# Patient Record
Sex: Male | Born: 1965 | Hispanic: Yes | Marital: Married | Smoking: Never smoker
Health system: Southern US, Community
[De-identification: ages and names within clinical notes are randomized; demographics above are authoritative.]

## PROBLEM LIST (undated history)

## (undated) DIAGNOSIS — Z789 Other specified health status: Secondary | ICD-10-CM

## (undated) HISTORY — PX: ANTERIOR CRUCIATE LIGAMENT REPAIR: SHX115

---

## 2007-02-16 ENCOUNTER — Emergency Department: Payer: Self-pay | Admitting: Emergency Medicine

## 2007-02-19 ENCOUNTER — Emergency Department: Payer: Self-pay | Admitting: Emergency Medicine

## 2007-05-05 ENCOUNTER — Ambulatory Visit: Payer: Self-pay | Admitting: Family Medicine

## 2010-12-17 ENCOUNTER — Ambulatory Visit: Payer: Self-pay | Admitting: Family Medicine

## 2010-12-17 ENCOUNTER — Encounter: Payer: Self-pay | Admitting: Family Medicine

## 2010-12-17 DIAGNOSIS — J309 Allergic rhinitis, unspecified: Secondary | ICD-10-CM | POA: Insufficient documentation

## 2010-12-17 DIAGNOSIS — J209 Acute bronchitis, unspecified: Secondary | ICD-10-CM

## 2010-12-26 NOTE — Assessment & Plan Note (Signed)
Summary: sinus infection/jbb   Vital Signs:  Patient Profile:   45 Years Old Male CC:      Ongoing Nasal Congestion O2 Sat:      99 % O2 treatment:    Room Air Temp:     97.9 degrees F oral Pulse rate:   92 / minute Pulse rhythm:   regular Resp:     14 per minute BP sitting:   131 / 81  (left arm)  Pt. in pain?   no  Vitals Entered By: Standley Dakins MD (December 17, 2010 6:49 PM)                   Current Allergies (reviewed today): No known allergies History of Present Illness History from: patient Chief Complaint: Ongoing Nasal Congestion History of Present Illness: The patient is presenting today because he continues to have ongoing symptoms of sinus congestion and drainage.  He is having postnasal drainage,  coughing and wheezing, especially at night; He saw his PCP several days ago and started taking a Z-pack and is almost completed with it but no significant improvement.  He is wheezing and feels plugged up in his head.  He is also having some maxillary sinus pain.  No fever or chills.  No nausea or diarrhea.    REVIEW OF SYSTEMS Constitutional Symptoms      Denies fever, chills, night sweats, weight loss, weight gain, and fatigue.  Eyes       Denies change in vision, eye pain, eye discharge, glasses, contact lenses, and eye surgery. Ear/Nose/Throat/Mouth       Complains of frequent runny nose, sinus problems, sore throat, and hoarseness.      Denies hearing loss/aids, change in hearing, ear pain, ear discharge, dizziness, frequent nose bleeds, and tooth pain or bleeding.  Respiratory       Complains of dry cough and wheezing.      Denies productive cough, shortness of breath, asthma, bronchitis, and emphysema/COPD.  Cardiovascular       Denies murmurs, chest pain, and tires easily with exhertion.    Gastrointestinal       Denies stomach pain, nausea/vomiting, diarrhea, constipation, blood in bowel movements, and indigestion. Genitourniary       Denies painful  urination, kidney stones, and loss of urinary control. Neurological       Denies paralysis, seizures, and fainting/blackouts. Musculoskeletal       Denies muscle pain, joint pain, joint stiffness, decreased range of motion, redness, swelling, muscle weakness, and gout.  Skin       Denies bruising, unusual mles/lumps or sores, and hair/skin or nail changes.  Psych       Denies mood changes, temper/anger issues, anxiety/stress, speech problems, depression, and sleep problems.  Past History:  Past Medical History: Unremarkable  Past Surgical History: Denies surgical history  Family History: No significant per patient  Social History: Occupation: Recruitment consultant Never Smoked Alcohol use-no Drug use-no Smoking Status:  never Drug Use:  no Physical Exam General appearance: well developed, well nourished, no acute distress Head: normocephalic, atraumatic Eyes: conjunctivae and lids normal Pupils: equal, round, reactive to light Ears: normal, no lesions or deformities Nasal: pale, boggy, swollen nasal turbinates Oral/Pharynx: tongue normal, posterior pharynx with erythema but no exudate Neck: neck supple,  trachea midline, no masses Chest/Lungs: no rales, wheezes, or rhonchi bilateral, breath sounds equal without effort Heart: regular rate and  rhythm, no murmur Abdomen: soft, non-tender without obvious organomegaly Extremities: normal extremities Neurological: grossly intact  and non-focal Skin: no obvious rashes or lesions MSE: oriented to time, place, and person Assessment New Problems: ACUTE BRONCHITIS (ICD-466.0) ALLERGIC RHINITIS CAUSE UNSPECIFIED (ICD-477.9)   Patient Education: Patient and/or caregiver instructed in the following: rest, fluids. The risks, benefits and possible side effects were clearly explained and discussed with the patient.  The patient verbalized clear understanding.  The patient was given instructions to return if symptoms don't improve,  worsen or new changes develop.  If it is not during clinic hours and the patient cannot get back to this clinic then the patient was told to seek medical care at an available urgent care or emergency department.  The patient verbalized understanding.   Demonstrates willingness to comply.  Plan New Medications/Changes: VENTOLIN HFA 108 (90 BASE) MCG/ACT AERS (ALBUTEROL SULFATE) 2 puffs inh every 4 hours as needed wheezing, cough, SOB: caution may cause mild palpatations  #1 x 0, 12/17/2010, Clanford Johnson MD FLUTICASONE PROPIONATE 50 MCG/ACT SUSP (FLUTICASONE PROPIONATE) 2 sprays per nostril once daily  #1 x 1, 12/17/2010, Clanford Johnson MD CETIRIZINE HCL 10 MG TABS (CETIRIZINE HCL) take 1 by mouth daily for allergies  #30 x 1, 12/17/2010, Clanford Johnson MD  Follow Up: Follow up in 2-3 days if no improvement, Follow up on an as needed basis, Follow up with Primary Physician  The patient and/or caregiver has been counseled thoroughly with regard to medications prescribed including dosage, schedule, interactions, rationale for use, and possible side effects and they verbalize understanding.  Diagnoses and expected course of recovery discussed and will return if not improved as expected or if the condition worsens. Patient and/or caregiver verbalized understanding.  Prescriptions: VENTOLIN HFA 108 (90 BASE) MCG/ACT AERS (ALBUTEROL SULFATE) 2 puffs inh every 4 hours as needed wheezing, cough, SOB: caution may cause mild palpatations  #1 x 0   Entered and Authorized by:   Standley Dakins MD   Signed by:   Standley Dakins MD on 12/17/2010   Method used:   Electronically to        [facility] [address] (retail)       [address], [address]       [place], [place]  [address]       Ph: [phone]       Fax: [phone]   RxID:   [id number] FLUTICASONE PROPIONATE 50 MCG/ACT SUSP (FLUTICASONE PROPIONATE) 2 sprays per nostril once daily  #1 x 1   Entered and  Authorized by:   Standley Dakins MD   Signed by:   Standley Dakins MD on 12/17/2010   Method used:   Electronically to        [facility] [address] (retail)       [address], [address]       [place], [place]  [address]       Ph: [phone]       Fax: [phone]   RxID:   [id number] CETIRIZINE HCL 10 MG TABS (CETIRIZINE HCL) take 1 by mouth daily for allergies  #30 x 1   Entered and Authorized by:   Standley Dakins MD   Signed by:   Standley Dakins MD on 12/17/2010   Method used:   Electronically to        [facility] [address] (retail)       [address], [address]       [place]  [place], [place]  [address]       Ph: [phone]       Fax: [phone]   RxID:   [id number]   Patient Instructions: 1)  Instructions verbally given to patient. The patient verbalized clear understanding.   2)  Go to the pharmacy and pick up your prescription (s).  It may take up to 30 mins for electronic prescriptions to be delivered to the pharmacy.  Please call if your pharmacy has not received your prescriptions after 30 minutes.   3)  Return or go to the ER if no improvement or symptoms getting worse.   4)  Finish the azithromycin medication as prescribed.  5)  Please start your new allergy medications right away.  6)  The patient was informed that there is no on-call provider or services available at this clinic during off-hours (when the clinic is closed).  If the patient developed a problem or concern that required immediate attention, the patient was advised to go the the nearest available urgent care or emergency department for medical care.  The patient verbalized understanding.

## 2018-09-29 ENCOUNTER — Other Ambulatory Visit: Payer: Self-pay | Admitting: Internal Medicine

## 2018-09-29 ENCOUNTER — Ambulatory Visit
Admission: RE | Admit: 2018-09-29 | Discharge: 2018-09-29 | Disposition: A | Discharge status: Home / Self Care | Payer: PRIVATE HEALTH INSURANCE | Source: Ambulatory Visit | Attending: Internal Medicine | Admitting: Internal Medicine

## 2018-09-29 DIAGNOSIS — K352 Acute appendicitis with generalized peritonitis, without abscess: Secondary | ICD-10-CM

## 2018-09-29 MED ORDER — IOPAMIDOL (ISOVUE-300) INJECTION 61%
100.0000 mL | Freq: Once | INTRAVENOUS | Status: AC | PRN
  Administered 2018-09-29: 100 mL via INTRAVENOUS

## 2019-11-16 ENCOUNTER — Other Ambulatory Visit: Payer: PRIVATE HEALTH INSURANCE

## 2020-06-28 IMAGING — CT CT ABD-PELV W/ CM
2 of 5 series · 15 of 46 positions shown, 17 images · IV contrast (APPLIED)
Comparison: None.

CLINICAL DATA: Initial evaluation for acute right lower quadrant
abdominal pain with fever.

EXAM:
CT ABDOMEN AND PELVIS WITH CONTRAST
TECHNIQUE: Multidetector CT imaging of the abdomen and pelvis was performed
using the standard protocol following bolus administration of
intravenous contrast.
CONTRAST:  100mL 64GBWC-PGG IOPAMIDOL (64GBWC-PGG) INJECTION 61%

[Series 2: axial st · axial · 0.76mm/px · z∈[-1189,-739]mm · 12 of 102 slices shown, 14 images]
[im 6/102  soft-tissue]
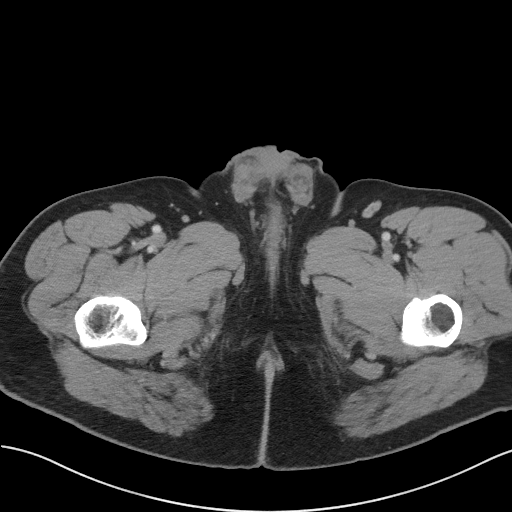
[im 6/102  bone]
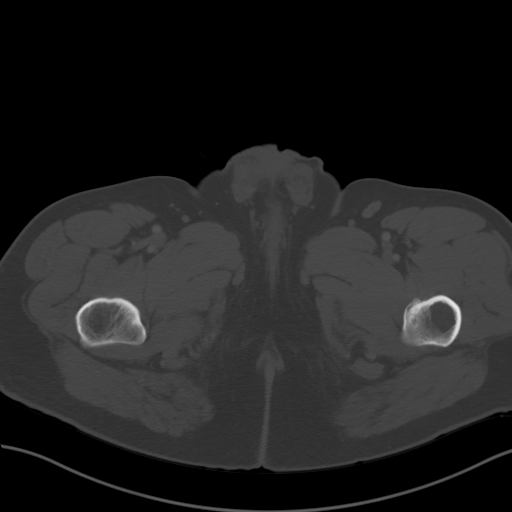
[im 17/102  soft-tissue]
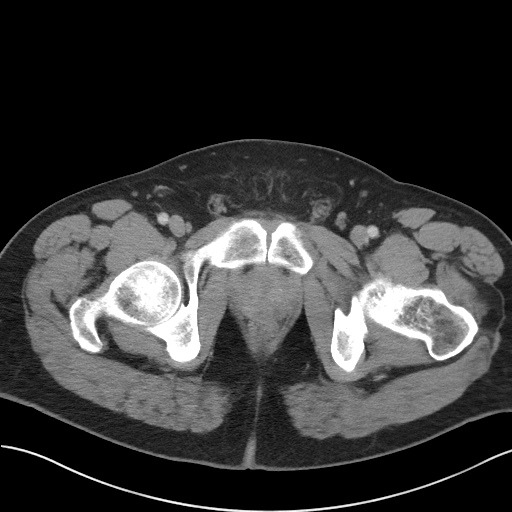
[im 23/102  soft-tissue]
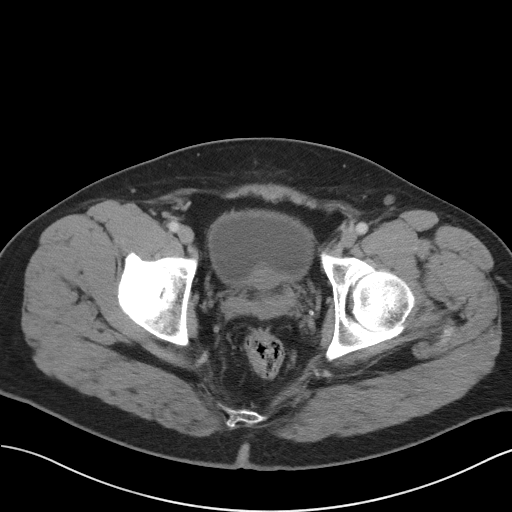
[im 29/102  soft-tissue]
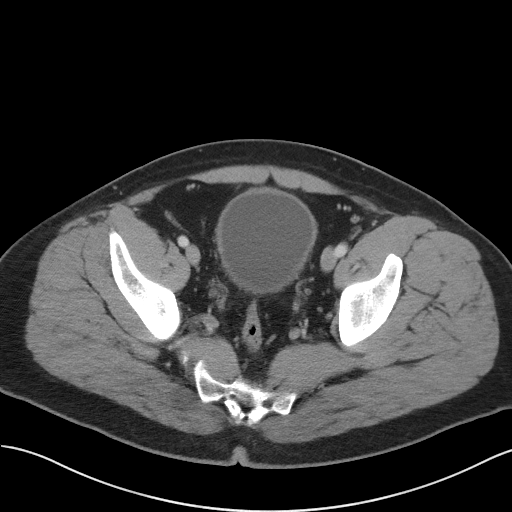
[im 40/102  soft-tissue]
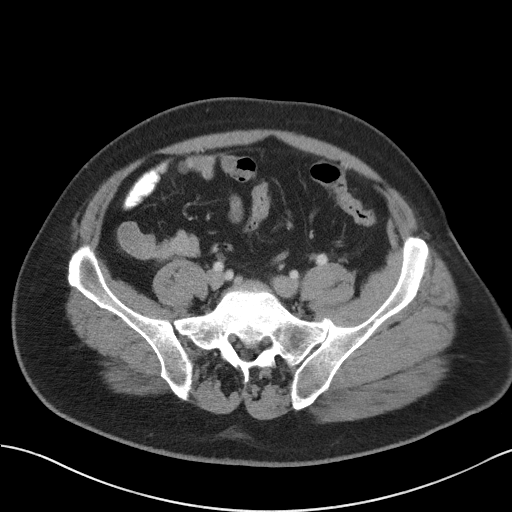
[im 45/102  soft-tissue]
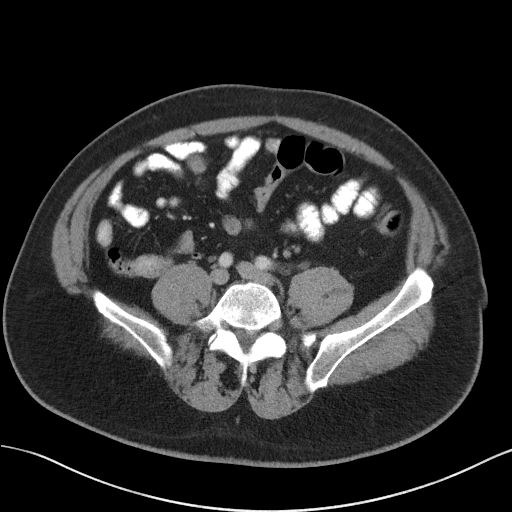
[im 57/102  soft-tissue]
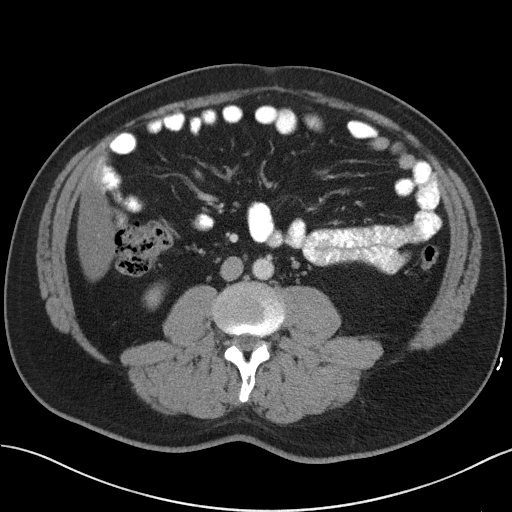
[im 62/102  soft-tissue]
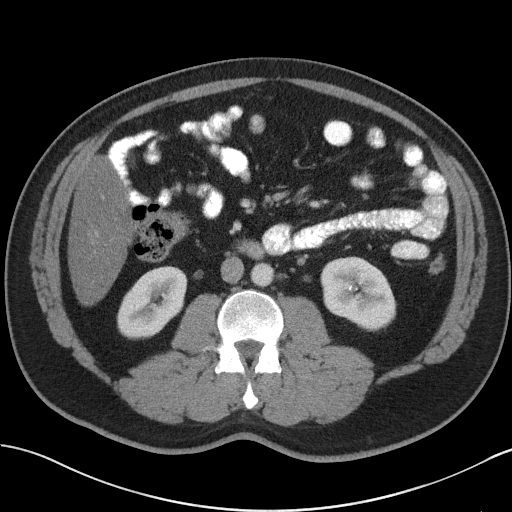
[im 73/102  soft-tissue]
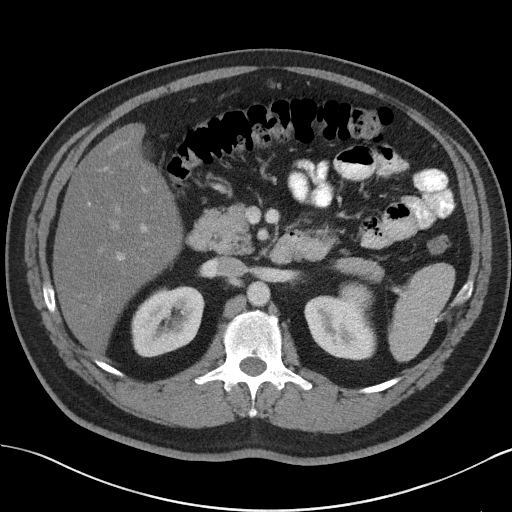
[im 73/102  bone]
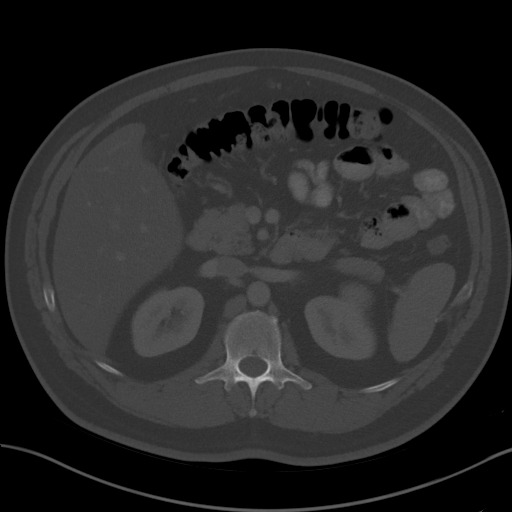
[im 79/102  soft-tissue]
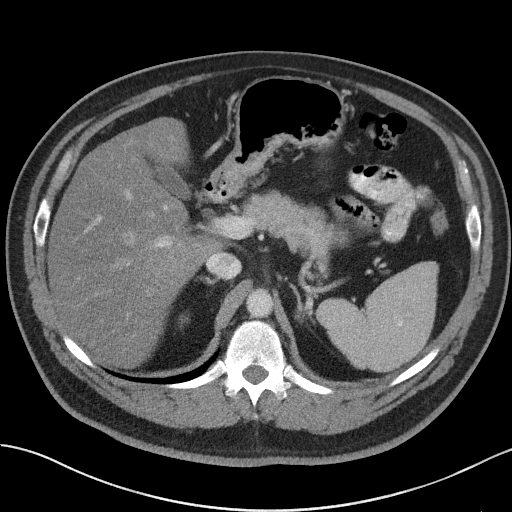
[im 85/102  soft-tissue]
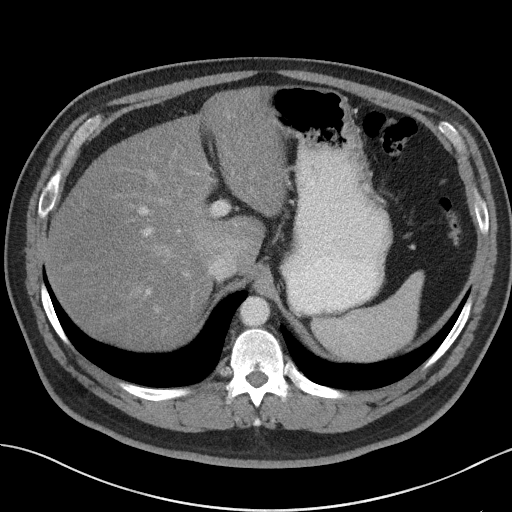
[im 96/102  soft-tissue]
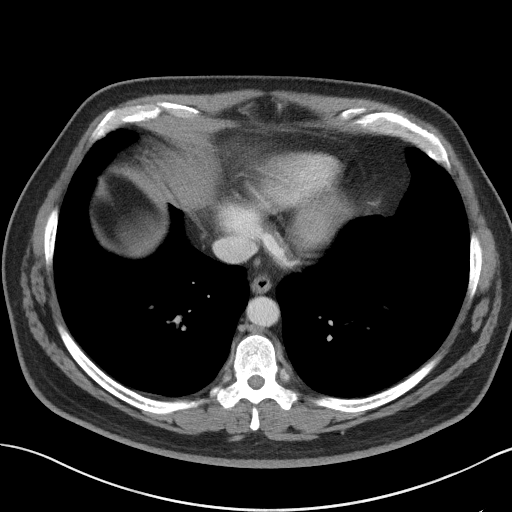

[Series 5: coronal st · coronal · 0.85mm/px · 3 of 103 slices shown]
[im 35/103  soft-tissue]
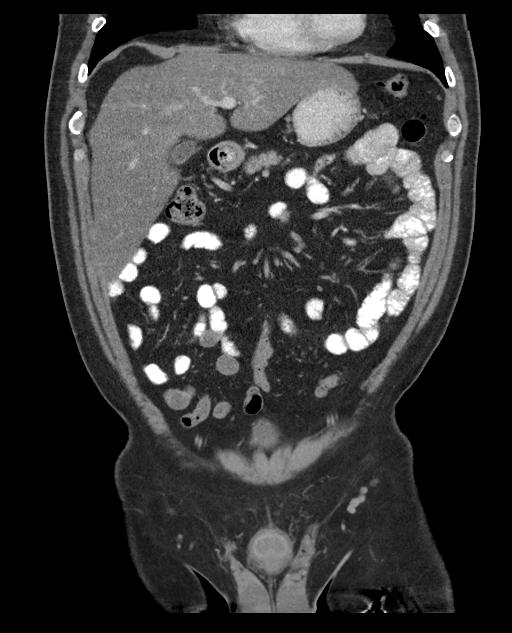
[im 46/103  soft-tissue]
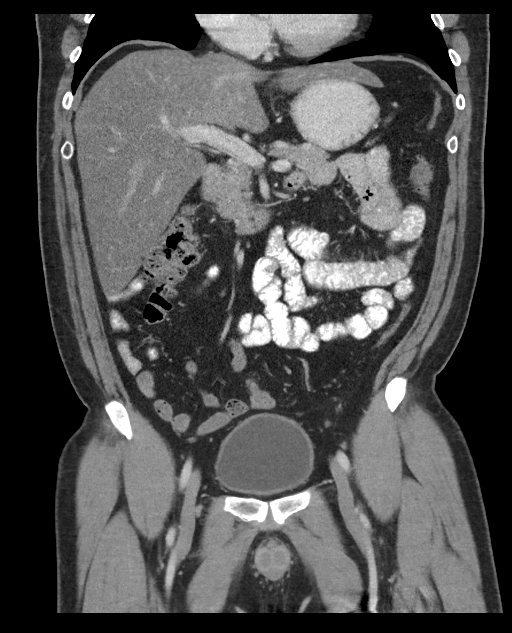
[im 57/103  soft-tissue]
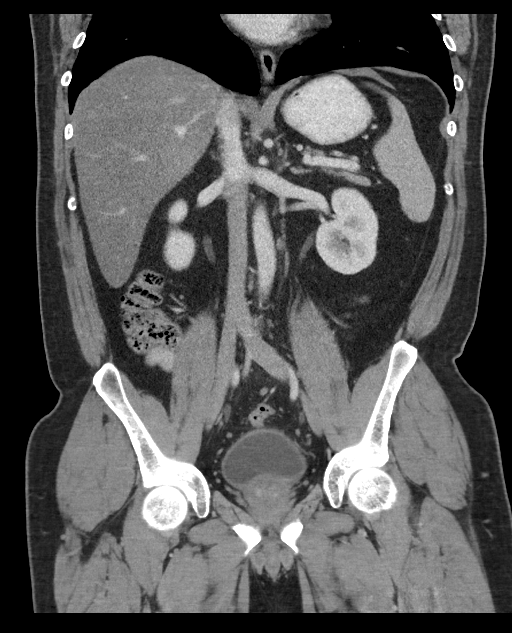

[15 of 46 positions shown; findings below may reference images not displayed]

FINDINGS: Lower chest: Visualized lung bases are clear.

Hepatobiliary: Diffuse hypoattenuation liver consistent with
steatosis. Focal fatty sparing noted adjacent to the gallbladder
fossa. Gallbladder itself demonstrates no acute finding. No biliary
dilatation.

Pancreas: Pancreas within normal limits.

Spleen: Spleen within normal limits.

Adrenals/Urinary Tract: Adrenal glands are normal. Kidneys equal in
size with symmetric enhancement. Subcentimeter hypodensity at the
lower pole the left kidney too small the characterize, but
statistically likely reflects a small cyst. Two small nonobstructive
calculi measuring up to 3 mm present at the lower pole left kidney.
No other radiopaque calculi. No hydronephrosis or hydroureter. No
focal enhancing renal mass. Mild circumferential bladder wall
thickening about the partially distended bladder. While this may be
related incomplete distension, possible acute cystitis could also
have this appearance. Bladder otherwise unremarkable.

Stomach/Bowel: Stomach within normal limits. No evidence for bowel
obstruction. Normal appendix. No abnormal wall thickening, mucosal
enhancement, or inflammatory fat stranding seen elsewhere about the
bowels.

Vascular/Lymphatic: Normal intravascular enhancement seen throughout
the intra-abdominal aorta and its branch vessels. No aneurysm. No
adenopathy.

Reproductive: Prostate enlarged measuring 5.5 cm in transverse
diameter.

Other: No free air or fluid. No made of a 11 mm focus of fat
necrosis within the left anterior abdomen (series 2, image 52),
likely incidental in nature and of doubtful clinical significance
given patient's symptoms.

Musculoskeletal: No acute osseus abnormality. No discrete lytic or
blastic osseous lesions.
IMPRESSION: 1. Mild circumferential bladder wall thickening. While this finding
may be related incomplete distension, possible acute cystitis could
also have this appearance. Correlation with urinalysis recommended.
2. No other acute intra-abdominal or pelvic process. Normal
appendix.
3. Hepatic steatosis.
4. Enlarged prostate.

## 2020-10-30 ENCOUNTER — Other Ambulatory Visit: Payer: PRIVATE HEALTH INSURANCE

## 2020-10-30 DIAGNOSIS — Z20822 Contact with and (suspected) exposure to covid-19: Secondary | ICD-10-CM

## 2020-10-31 LAB — SARS-COV-2, NAA 2 DAY TAT

## 2020-10-31 LAB — NOVEL CORONAVIRUS, NAA: SARS-CoV-2, NAA: NOT DETECTED

## 2021-08-01 NOTE — Progress Notes (Deleted)
  [facility] [address] [place] [address] Phone: [phone] Subjective:    I'm seeing this patient by the request  of:  Danella Penton, MD  CC:   [id number]  Samik Balkcom is a 55 y.o. male coming in with complaint of R ankle pain. Patient states       No past medical history on file. *** The histories are not reviewed yet. Please review them in the "History" navigator section and refresh this SmartLink. Social History   Socioeconomic History   Marital status: Married    Spouse name: Not on file   Number of children: Not on file   Years of education: Not on file   Highest education level: Not on file  Occupational History   Not on file  Tobacco Use   Smoking status: Not on file   Smokeless tobacco: Not on file  Substance and Sexual Activity   Alcohol use: Not on file   Drug use: Not on file   Sexual activity: Not on file  Other Topics Concern   Not on file  Social History Narrative   Not on file   Social Determinants of Health   Financial Resource Strain: Not on file  Food Insecurity: Not on file  Transportation Needs: Not on file  Physical Activity: Not on file  Stress: Not on file  Social Connections: Not on file   Not on File No family history on file. No current outpatient medications on file.   Reviewed prior external information including notes and imaging from  primary care provider As well as notes that were available from care everywhere and other healthcare systems.  Past medical history, social, surgical and family history all reviewed in electronic medical record.  No pertanent information unless stated regarding to the chief complaint.   Review of Systems:  No headache, visual changes, nausea, vomiting, diarrhea, constipation, dizziness, abdominal pain, skin rash, fevers, chills, night sweats, weight loss, swollen lymph nodes, body aches, joint swelling, chest pain, shortness of breath, mood  changes. POSITIVE muscle aches  Objective  There were no vitals taken for this visit.   General: No apparent distress alert and oriented x3 mood and affect normal, dressed appropriately.  HEENT: Pupils equal, extraocular movements intact  Respiratory: Patient's speak in full sentences and does not appear short of breath  Cardiovascular: No lower extremity edema, non tender, no erythema  Gait normal with good balance and coordination.  MSK:  Non tender with full range of motion and good stability and symmetric strength and tone of shoulders, elbows, wrist, hip, knee and ankles bilaterally.     Impression and Recommendations:     The above documentation has been reviewed and is accurate and complete Wilford Grist

## 2021-08-02 ENCOUNTER — Ambulatory Visit: Payer: PRIVATE HEALTH INSURANCE | Admitting: Family Medicine

## 2021-08-02 ENCOUNTER — Other Ambulatory Visit: Payer: Self-pay

## 2021-08-28 ENCOUNTER — Ambulatory Visit: Payer: PRIVATE HEALTH INSURANCE | Admitting: Family Medicine

## 2022-01-29 ENCOUNTER — Other Ambulatory Visit: Payer: Self-pay | Admitting: Internal Medicine

## 2022-01-29 ENCOUNTER — Ambulatory Visit: Payer: Self-pay | Admitting: General Surgery

## 2022-01-29 ENCOUNTER — Encounter: Payer: Self-pay | Admitting: General Surgery

## 2022-01-29 ENCOUNTER — Other Ambulatory Visit: Payer: Self-pay

## 2022-01-29 ENCOUNTER — Ambulatory Visit
Admission: RE | Admit: 2022-01-29 | Discharge: 2022-01-29 | Disposition: A | Discharge status: Home / Self Care | Payer: 59 | Source: Ambulatory Visit | Attending: Internal Medicine | Admitting: Internal Medicine

## 2022-01-29 ENCOUNTER — Observation Stay
Admission: RE | Admit: 2022-01-29 | Discharge: 2022-01-30 | Disposition: A | Discharge status: Home / Self Care | Payer: 59 | Source: Ambulatory Visit | Attending: General Surgery | Admitting: General Surgery

## 2022-01-29 ENCOUNTER — Encounter
Admission: RE | Disposition: A | Discharge status: Home / Self Care | Payer: Self-pay | Source: Ambulatory Visit | Attending: General Surgery

## 2022-01-29 ENCOUNTER — Ambulatory Visit: Payer: 59 | Admitting: Anesthesiology

## 2022-01-29 DIAGNOSIS — K353 Acute appendicitis with localized peritonitis, without perforation or gangrene: Secondary | ICD-10-CM

## 2022-01-29 DIAGNOSIS — K810 Acute cholecystitis: Secondary | ICD-10-CM

## 2022-01-29 DIAGNOSIS — K8000 Calculus of gallbladder with acute cholecystitis without obstruction: Principal | ICD-10-CM | POA: Insufficient documentation

## 2022-01-29 DIAGNOSIS — R1011 Right upper quadrant pain: Secondary | ICD-10-CM | POA: Diagnosis present

## 2022-01-29 HISTORY — DX: Other specified health status: Z78.9

## 2022-01-29 SURGERY — CHOLECYSTECTOMY, ROBOT-ASSISTED, LAPAROSCOPIC
Anesthesia: General | Site: Abdomen

## 2022-01-29 MED ORDER — INDOCYANINE GREEN 25 MG IV SOLR
INTRAVENOUS | Status: DC | PRN
  Administered 2022-01-29: 2.5 mg via INTRAVENOUS

## 2022-01-29 MED ORDER — ONDANSETRON HCL 4 MG/2ML IJ SOLN: 4.0000 mg | Freq: Four times a day (QID) | INTRAMUSCULAR | Status: DC | PRN

## 2022-01-29 MED ORDER — PROPOFOL 10 MG/ML IV BOLUS
INTRAVENOUS | Status: DC | PRN
Start: 2022-01-29 — End: 2022-01-29
  Administered 2022-01-29: 180 mg via INTRAVENOUS

## 2022-01-29 MED ORDER — DEXAMETHASONE SODIUM PHOSPHATE 10 MG/ML IJ SOLN
INTRAMUSCULAR | Status: DC | PRN
  Administered 2022-01-29: 10 mg via INTRAVENOUS

## 2022-01-29 MED ORDER — ACETAMINOPHEN 10 MG/ML IV SOLN
INTRAVENOUS | Status: DC | PRN
  Administered 2022-01-29: 1000 mg via INTRAVENOUS

## 2022-01-29 MED ORDER — LIDOCAINE HCL (CARDIAC) PF 100 MG/5ML IV SOSY
PREFILLED_SYRINGE | INTRAVENOUS | Status: DC | PRN
Start: 2022-01-29 — End: 2022-01-29
  Administered 2022-01-29: 100 mg via INTRAVENOUS

## 2022-01-29 MED ORDER — CEFAZOLIN SODIUM-DEXTROSE 2-4 GM/100ML-% IV SOLN
INTRAVENOUS | Status: AC
  Filled 2022-01-29: qty 100

## 2022-01-29 MED ORDER — INDOCYANINE GREEN 25 MG IV SOLR: 1.2500 mg | Freq: Once | INTRAVENOUS | Status: DC

## 2022-01-29 MED ORDER — ONDANSETRON 4 MG PO TBDP
4.0000 mg | ORAL_TABLET | Freq: Four times a day (QID) | ORAL | Status: DC | PRN
  Filled 2022-01-29: qty 1

## 2022-01-29 MED ORDER — BUPIVACAINE-EPINEPHRINE 0.25% -1:200000 IJ SOLN
INTRAMUSCULAR | Status: DC | PRN
  Administered 2022-01-29: 20 mL

## 2022-01-29 MED ORDER — ROCURONIUM BROMIDE 100 MG/10ML IV SOLN
INTRAVENOUS | Status: DC | PRN
Start: 2022-01-29 — End: 2022-01-29
  Administered 2022-01-29: 10 mg via INTRAVENOUS
  Administered 2022-01-29: 20 mg via INTRAVENOUS
  Administered 2022-01-29: 50 mg via INTRAVENOUS
  Administered 2022-01-29: 20 mg via INTRAVENOUS

## 2022-01-29 MED ORDER — CEFAZOLIN SODIUM-DEXTROSE 2-4 GM/100ML-% IV SOLN: 2.0000 g | INTRAVENOUS | Status: DC

## 2022-01-29 MED ORDER — MIDAZOLAM HCL 2 MG/2ML IJ SOLN
INTRAMUSCULAR | Status: AC
  Filled 2022-01-29: qty 2

## 2022-01-29 MED ORDER — SUGAMMADEX SODIUM 200 MG/2ML IV SOLN
INTRAVENOUS | Status: DC | PRN
  Administered 2022-01-29: 200 mg via INTRAVENOUS

## 2022-01-29 MED ORDER — CEFAZOLIN SODIUM-DEXTROSE 2-4 GM/100ML-% IV SOLN
2.0000 g | INTRAVENOUS | Status: AC
  Administered 2022-01-29: 2 g via INTRAVENOUS

## 2022-01-29 MED ORDER — FENTANYL CITRATE (PF) 100 MCG/2ML IJ SOLN
25.0000 ug | INTRAMUSCULAR | Status: DC | PRN
  Administered 2022-01-29: 50 ug via INTRAVENOUS

## 2022-01-29 MED ORDER — FENTANYL CITRATE (PF) 100 MCG/2ML IJ SOLN
INTRAMUSCULAR | Status: AC
  Administered 2022-01-29: 50 ug via INTRAVENOUS
  Filled 2022-01-29: qty 2

## 2022-01-29 MED ORDER — SUCCINYLCHOLINE CHLORIDE 200 MG/10ML IV SOSY
PREFILLED_SYRINGE | INTRAVENOUS | Status: AC
  Filled 2022-01-29: qty 10

## 2022-01-29 MED ORDER — FENTANYL CITRATE (PF) 250 MCG/5ML IJ SOLN
INTRAMUSCULAR | Status: AC
  Filled 2022-01-29: qty 5

## 2022-01-29 MED ORDER — ENOXAPARIN SODIUM 40 MG/0.4ML IJ SOSY
40.0000 mg | PREFILLED_SYRINGE | INTRAMUSCULAR | Status: DC
  Administered 2022-01-30: 40 mg via SUBCUTANEOUS
  Filled 2022-01-29: qty 0.4

## 2022-01-29 MED ORDER — INDOCYANINE GREEN 25 MG IV SOLR
1.2500 mg | Freq: Once | INTRAVENOUS | Status: DC
  Filled 2022-01-29: qty 10

## 2022-01-29 MED ORDER — PROPOFOL 10 MG/ML IV BOLUS
INTRAVENOUS | Status: AC
  Filled 2022-01-29: qty 20

## 2022-01-29 MED ORDER — SODIUM CHLORIDE 0.9 % IV SOLN: INTRAVENOUS | Status: DC

## 2022-01-29 MED ORDER — HYDROCODONE-ACETAMINOPHEN 5-325 MG PO TABS: 1.0000 | ORAL_TABLET | ORAL | Status: DC | PRN

## 2022-01-29 MED ORDER — ACETAMINOPHEN 10 MG/ML IV SOLN
INTRAVENOUS | Status: AC
  Filled 2022-01-29: qty 100

## 2022-01-29 MED ORDER — 0.9 % SODIUM CHLORIDE (POUR BTL) OPTIME
TOPICAL | Status: DC | PRN
  Administered 2022-01-29: 5 mL

## 2022-01-29 MED ORDER — FENTANYL CITRATE (PF) 100 MCG/2ML IJ SOLN
INTRAMUSCULAR | Status: DC | PRN
  Administered 2022-01-29: 25 ug via INTRAVENOUS
  Administered 2022-01-29 (×3): 50 ug via INTRAVENOUS

## 2022-01-29 MED ORDER — MIDAZOLAM HCL 2 MG/2ML IJ SOLN
INTRAMUSCULAR | Status: DC | PRN
  Administered 2022-01-29: 2 mg via INTRAVENOUS

## 2022-01-29 MED ORDER — SODIUM CHLORIDE 0.9 % IR SOLN
Status: DC | PRN
Start: 2022-01-29 — End: 2022-01-29
  Administered 2022-01-29: 400 mL

## 2022-01-29 MED ORDER — KETOROLAC TROMETHAMINE 30 MG/ML IJ SOLN
INTRAMUSCULAR | Status: AC
  Filled 2022-01-29: qty 1

## 2022-01-29 MED ORDER — ONDANSETRON HCL 4 MG/2ML IJ SOLN: 4.0000 mg | Freq: Once | INTRAMUSCULAR | Status: DC | PRN

## 2022-01-29 MED ORDER — LACTATED RINGERS IV SOLN: INTRAVENOUS | Status: DC | PRN

## 2022-01-29 MED ORDER — PHENYLEPHRINE 40 MCG/ML (10ML) SYRINGE FOR IV PUSH (FOR BLOOD PRESSURE SUPPORT)
PREFILLED_SYRINGE | INTRAVENOUS | Status: DC | PRN
  Administered 2022-01-29: 80 ug via INTRAVENOUS

## 2022-01-29 MED ORDER — MORPHINE SULFATE (PF) 4 MG/ML IV SOLN: 4.0000 mg | INTRAVENOUS | Status: DC | PRN

## 2022-01-29 MED ORDER — BUPIVACAINE-EPINEPHRINE (PF) 0.25% -1:200000 IJ SOLN
INTRAMUSCULAR | Status: AC
  Filled 2022-01-29: qty 30

## 2022-01-29 MED ORDER — PANTOPRAZOLE SODIUM 40 MG IV SOLR
40.0000 mg | Freq: Every day | INTRAVENOUS | Status: DC
  Administered 2022-01-29: 40 mg via INTRAVENOUS
  Filled 2022-01-29: qty 10

## 2022-01-29 SURGICAL SUPPLY — 55 items
BAG INFUSER PRESSURE 100CC (MISCELLANEOUS) ×1 IMPLANT
BAG RETRIEVAL 10 (BASKET) ×1
BLADE SURG SZ11 CARB STEEL (BLADE) ×2 IMPLANT
CANNULA REDUC XI 12-8 STAPL (CANNULA) ×1
CANNULA REDUCER 12-8 DVNC XI (CANNULA) ×1 IMPLANT
CATH REDDICK CHOLANGI 4FR 50CM (CATHETERS) IMPLANT
CLIP LIGATING HEM O LOK PURPLE (MISCELLANEOUS) IMPLANT
CLIP LIGATING HEMO O LOK GREEN (MISCELLANEOUS) ×2 IMPLANT
DERMABOND ADVANCED (GAUZE/BANDAGES/DRESSINGS) ×1
DERMABOND ADVANCED .7 DNX12 (GAUZE/BANDAGES/DRESSINGS) ×1 IMPLANT
DRAPE ARM DVNC X/XI (DISPOSABLE) ×4 IMPLANT
DRAPE C-ARM XRAY 36X54 (DRAPES) IMPLANT
DRAPE COLUMN DVNC XI (DISPOSABLE) ×1 IMPLANT
DRAPE DA VINCI XI ARM (DISPOSABLE) ×4
DRAPE DA VINCI XI COLUMN (DISPOSABLE) ×1
ELECT REM PT RETURN 9FT ADLT (ELECTROSURGICAL) ×2
ELECTRODE REM PT RTRN 9FT ADLT (ELECTROSURGICAL) ×1 IMPLANT
GLOVE SURG ENC MOIS LTX SZ6.5 (GLOVE) ×4 IMPLANT
GLOVE SURG UNDER POLY LF SZ6.5 (GLOVE) ×4 IMPLANT
GOWN STRL REUS W/ TWL LRG LVL3 (GOWN DISPOSABLE) ×3 IMPLANT
GOWN STRL REUS W/TWL LRG LVL3 (GOWN DISPOSABLE) ×3
GRASPER SUT TROCAR 14GX15 (MISCELLANEOUS) ×2 IMPLANT
IRRIGATOR SUCT 8 DISP DVNC XI (IRRIGATION / IRRIGATOR) IMPLANT
IRRIGATOR SUCTION 8MM XI DISP (IRRIGATION / IRRIGATOR) ×1
IV CATH ANGIO 12GX3 LT BLUE (NEEDLE) IMPLANT
IV NS 1000ML (IV SOLUTION) ×1
IV NS 1000ML BAXH (IV SOLUTION) IMPLANT
KIT PINK PAD W/HEAD ARE REST (MISCELLANEOUS) ×2 IMPLANT
KIT PINK PAD W/HEAD ARM REST (MISCELLANEOUS) ×1 IMPLANT
LABEL OR SOLS (LABEL) ×2 IMPLANT
MANIFOLD NEPTUNE II (INSTRUMENTS) ×2 IMPLANT
NDL INSUFFLATION 14GA 120MM (NEEDLE) ×1 IMPLANT
NEEDLE HYPO 22GX1.5 SAFETY (NEEDLE) ×2 IMPLANT
NEEDLE INSUFFLATION 14GA 120MM (NEEDLE) ×2 IMPLANT
NS IRRIG 500ML POUR BTL (IV SOLUTION) ×2 IMPLANT
OBTURATOR OPTICAL STANDARD 8MM (TROCAR) ×1
OBTURATOR OPTICAL STND 8 DVNC (TROCAR) ×1
OBTURATOR OPTICALSTD 8 DVNC (TROCAR) ×1 IMPLANT
PACK LAP CHOLECYSTECTOMY (MISCELLANEOUS) ×2 IMPLANT
SEAL CANN UNIV 5-8 DVNC XI (MISCELLANEOUS) ×3 IMPLANT
SEAL XI 5MM-8MM UNIVERSAL (MISCELLANEOUS) ×3
SET TUBE SMOKE EVAC HIGH FLOW (TUBING) ×2 IMPLANT
SOLUTION ELECTROLUBE (MISCELLANEOUS) ×2 IMPLANT
SPIKE FLUID TRANSFER (MISCELLANEOUS) ×2 IMPLANT
SPONGE T-LAP 4X18 ~~LOC~~+RFID (SPONGE) IMPLANT
STAPLER CANNULA SEAL DVNC XI (STAPLE) ×1 IMPLANT
STAPLER CANNULA SEAL XI (STAPLE) ×1
SUT MNCRL 4-0 (SUTURE) ×1
SUT MNCRL 4-0 27XMFL (SUTURE) ×1
SUT VIC AB 2-0 UR6 27 (SUTURE) ×1 IMPLANT
SUT VICRYL 0 AB UR-6 (SUTURE) ×2 IMPLANT
SUTURE MNCRL 4-0 27XMF (SUTURE) ×1 IMPLANT
SYS BAG RETRIEVAL 10MM (BASKET) ×1
SYSTEM BAG RETRIEVAL 10MM (BASKET) ×1 IMPLANT
WATER STERILE IRR 500ML POUR (IV SOLUTION) ×1 IMPLANT

## 2022-01-29 NOTE — Op Note (Signed)
Preoperative diagnosis: Acute cholecystitis ? ?Postoperative diagnosis: Same ? ?Procedure: Robotic Assisted Laparoscopic Cholecystectomy.  ? ?Anesthesia: GETA ?  ?Surgeon: Dr. Windell Moment ? ?Wound Classification: Clean Contaminated ? ?Indications: Patient is a 56 y.o. male developed right upper quadrant pain 3 days ago with leukocytosis and on workup was found to have cholelithiasis with a normal common duct and cholecystitis. Robotic Assisted Laparoscopic cholecystectomy was elected. ? ?Findings: ?Severe acute on chronic edema.  ?Critical view of safety achieved ?Cystic duct and artery identified, ligated and divided ?Adequate hemostasis ? ? ? ? ? ? ? ? ? ? ? ? ?Description of procedure: The patient was placed on the operating table in the supine position. General anesthesia was induced. A time-out was completed verifying correct patient, procedure, site, positioning, and implant(s) and/or special equipment prior to beginning this procedure. An orogastric tube was placed. The abdomen was prepped and draped in the usual sterile fashion.  ?An incision was made in a natural skin line below the umbilicus.  ?The fascia was elevated and the Veress needle inserted. Proper position was confirmed by aspiration and saline meniscus test.  ?The abdomen was insufflated with carbon dioxide to a pressure of 15 mmHg. The patient tolerated insufflation well. A 8-mm trocar was then inserted in optiview fashion.  ?The laparoscope was inserted and the abdomen inspected. No injuries from initial trocar placement were noted. Additional trocars were then inserted in the following locations: an 8-mm trocar in the left lateral abdomen, and another two 8-mm trocars to the right side of the abdomen 5 cm appart. The umbilical trocar was changed to a 12 mm trocar all under direct visualization. The abdomen was inspected and no abnormalities were found. The table was placed in the reverse Trendelenburg position with the right side up. The  robotic arms were docked and target anatomy identified. Instrument inserted under direct visualization.  ?Difficult and time consuming lysis of adhesions between the gallbladder and omentum, duodenum and transverse colon was done with electrocautery and suction. The dome of the gallbladder was needed to be opened for the gallbladder to be suctioned to be able to be was grasped with a prograsp and retracted over the dome of the liver. The infundibulum was also grasped with an atraumatic grasper and retracted toward the right lower quadrant. This maneuver exposed Calot?s triangle. The peritoneum overlying the gallbladder infundibulum was then incised which also was very difficult due to the acute overt chronic inflammation. and the cystic duct and cystic artery identified and circumferentially dissected. Critical view of safety reviewed before ligating any structure. Firefly images taken to visualize biliary ducts. The cystic duct and cystic artery were then doubly clipped and divided close to the gallbladder.  ?The gallbladder was then dissected from its peritoneal attachments by electrocautery. Hemostasis was checked and the gallbladder and contained stones were removed using an endoscopic retrieval bag. The gallbladder was passed off the table as a specimen. The gallbladder fossa was copiously irrigated with saline and hemostasis was obtained. There was no evidence of bleeding from the gallbladder fossa or cystic artery or leakage of the bile from the cystic duct stump. Secondary trocars were removed under direct vision. No bleeding was noted. The robotic arms were undoked. The scope was withdrawn and the umbilical trocar removed. The abdomen was allowed to collapse. The fascia of the 45mm trocar sites was closed with figure-of-eight 0 vicryl sutures. The skin was closed with subcuticular sutures of 4-0 monocryl and topical skin adhesive. The orogastric tube was removed.  ?  The patient tolerated the procedure well  and was taken to the postanesthesia care unit in stable condition.  ? ?Specimen: Gallbladder ? ?Complications: None ? ?EBL: 25 mL ? ?

## 2022-01-29 NOTE — Anesthesia Procedure Notes (Signed)
Procedure Name: Intubation ?Date/Time: 01/29/2022 5:39 PM ?Performed by: Tammi Klippel, CRNA ?Pre-anesthesia Checklist: Patient identified, Patient being monitored, Timeout performed, Emergency Drugs available and Suction available ?Patient Re-evaluated:Patient Re-evaluated prior to induction ?Oxygen Delivery Method: Circle system utilized ?Preoxygenation: Pre-oxygenation with 100% oxygen ?Induction Type: IV induction ?Ventilation: Mask ventilation without difficulty ?Laryngoscope Size: 3 and McGraph ?Grade View: Grade I ?Tube type: Oral ?Tube size: 7.0 mm ?Number of attempts: 1 ?Airway Equipment and Method: Stylet and Video-laryngoscopy ?Placement Confirmation: ETT inserted through vocal cords under direct vision, positive ETCO2 and breath sounds checked- equal and bilateral ?Secured at: 21 cm ?Tube secured with: Tape ?Dental Injury: Teeth and Oropharynx as per pre-operative assessment  ? ? ? ? ?

## 2022-01-29 NOTE — Anesthesia Preprocedure Evaluation (Signed)
Anesthesia Evaluation  ?Patient identified by MRN, date of birth, ID band ?Patient awake ? ? ? ?Reviewed: ?Allergy & Precautions, H&P , NPO status , Patient's Chart, lab work & pertinent test results, reviewed documented beta blocker date and time  ? ?History of Anesthesia Complications ?Negative for: history of anesthetic complications ? ?Airway ?Mallampati: II ? ?TM Distance: >3 FB ?Neck ROM: full ? ? ? Dental ? ?(+) Dental Advidsory Given, Caps, Missing, Chipped ?  ?Pulmonary ?neg pulmonary ROS,  ?  ?Pulmonary exam normal ?breath sounds clear to auscultation ? ? ? ? ? ? Cardiovascular ?Exercise Tolerance: Good ?negative cardio ROS ?Normal cardiovascular exam ?Rhythm:regular Rate:Normal ? ? ?  ?Neuro/Psych ?negative neurological ROS ? negative psych ROS  ? GI/Hepatic ?negative GI ROS, Neg liver ROS,   ?Endo/Other  ?negative endocrine ROS ? Renal/GU ?negative Renal ROS  ?negative genitourinary ?  ?Musculoskeletal ? ? Abdominal ?  ?Peds ? Hematology ?negative hematology ROS ?(+)   ?Anesthesia Other Findings ?Past Medical History: ?No date: Medical history non-contributory ? ? Reproductive/Obstetrics ?negative OB ROS ? ?  ? ? ? ? ? ? ? ? ? ? ? ? ? ?  ?  ? ? ? ? ? ? ? ? ?Anesthesia Physical ?Anesthesia Plan ? ?ASA: 1 ? ?Anesthesia Plan: General  ? ?Post-op Pain Management:   ? ?Induction: Intravenous ? ?PONV Risk Score and Plan: 2 and Ondansetron, Dexamethasone, Midazolam and Treatment may vary due to age or medical condition ? ?Airway Management Planned: Oral ETT ? ?Additional Equipment:  ? ?Intra-op Plan:  ? ?Post-operative Plan: Extubation in OR ? ?Informed Consent: I have reviewed the patients History and Physical, chart, labs and discussed the procedure including the risks, benefits and alternatives for the proposed anesthesia with the patient or authorized representative who has indicated his/her understanding and acceptance.  ? ? ? ?Dental Advisory Given ? ?Plan Discussed with:  Anesthesiologist, CRNA and Surgeon ? ?Anesthesia Plan Comments:   ? ? ? ? ? ? ?Anesthesia Quick Evaluation ? ?

## 2022-01-29 NOTE — H&P (View-Only) (Signed)
SURGICAL CONSULTATION NOTE  ? ?HISTORY OF PRESENT ILLNESS (HPI):  ?56 y.o. male presented to [facility] clinic primary care physician for evaluation of abdominal pain since 3 days ago. Patient reports he started having abdominal pain 3 days ago.  Pain localized to the right upper quadrant.  Pain does not radiate to other part of body.  Pain is aggravated by applying pressure.  There has been no alleviating factors.  Patient endorses having fever. ? ?Upon work-up by his PCP he was found with leukocytosis of 15,000.  He had CT scan of the abdomen that shows fat stranding around the bladder.  Abdominal ultrasound shows gallbladder with thickening with positive Murphy sign.  Common bile duct 0.4 mm.  Minimally elevated bilirubin with normal common bile duct stone.  I personally evaluated the images of the CT scan of the abdomen and the ultrasound of the abdomen. ? ?Surgery is consulted by Dr. Sabra Heck in this context for evaluation and management of acute cholecystitis. ? ?PAST MEDICAL HISTORY (PMH):  ?Past medical history reviewed.  No pertinent past medical history ? ?PAST SURGICAL HISTORY ([place]):  ?Past surgical history review.  No pertinent past surgical history ? ?MEDICATIONS:  ?Prior to Admission medications   ?Not on File  ?Patient denies taking any medication at home ? ?ALLERGIES:  ?No known drug allergies ? ?SOCIAL HISTORY:  ?Social History  ? ?Socioeconomic History  ? Marital status: Married  ?  Spouse name: Not on file  ? Number of children: Not on file  ? Years of education: Not on file  ? Highest education level: Not on file  ?Occupational History  ? Not on file  ?Tobacco Use  ? Smoking status: Not on file  ? Smokeless tobacco: Not on file  ?Substance and Sexual Activity  ? Alcohol use: Not on file  ? Drug use: Not on file  ? Sexual activity: Not on file  ?Other Topics Concern  ? Not on file  ?Social History Narrative  ? Not on file  ? ?Social Determinants of Health  ? ?Financial Resource Strain: Not on file   ?Food Insecurity: Not on file  ?Transportation Needs: Not on file  ?Physical Activity: Not on file  ?Stress: Not on file  ?Social Connections: Not on file  ?Intimate Partner Violence: Not on file  ?  ? ?FAMILY HISTORY:  ?Family history reviewed.  No pertinent family history ? ?REVIEW OF SYSTEMS:  ?Constitutional: denies weight loss, positive for fever, chills, or sweats  ?Eyes: denies any other vision changes, history of eye injury  ?ENT: denies sore throat, hearing problems  ?Respiratory: denies shortness of breath, wheezing  ?Cardiovascular: denies chest pain, palpitations  ?Gastrointestinal: positive abdominal pain, nausea and vomitnig ?Genitourinary: denies burning with urination or urinary frequency ?Musculoskeletal: denies any other joint pains or cramps  ?Skin: denies any other rashes or skin discolorations  ?Neurological: denies any other headache, dizziness, weakness  ?Psychiatric: denies any other depression, anxiety  ? ?All other review of systems were negative  ? ?VITAL SIGNS:  ?Blood pressure: 118/82 ?Heart rate: 85 ?Temperature 37.7 ?C ?Pain score: 5 out of 10 localized to the right upper quadrant ? ? ?PHYSICAL EXAM:  ?Constitutional:  ?-- Normal body habitus  ?-- Awake, alert, and oriented x3  ?Eyes:  ?-- Pupils equally round and reactive to light  ?-- No scleral icterus  ?Ear, nose, and throat:  ?-- No jugular venous distension  ?Pulmonary:  ?-- No crackles  ?-- Equal breath sounds bilaterally ?-- Breathing non-labored at rest ?  Cardiovascular:  ?-- S1, S2 present  ?-- No pericardial rubs ?Gastrointestinal:  ?-- Abdomen soft, tender tender to palpation to the right upper quadrant, non-distended, no guarding or rebound tenderness ?-- No abdominal masses appreciated, pulsatile or otherwise  ?Musculoskeletal and Integumentary:  ?-- Wounds: None appreciated ?-- Extremities: B/L UE and LE FROM, hands and feet warm, no edema  ?Neurologic:  ?-- Motor function: intact and symmetric ?-- Sensation: intact and  symmetric ? ? ?Labs:  ?   ? View : No data to display.  ?  ?  ?  ? ?   ? View : No data to display.  ?  ?  ?  ? ?Imaging studies:  ?EXAM: ?ULTRASOUND ABDOMEN LIMITED RIGHT UPPER QUADRANT ?  ?COMPARISON:  01/29/2022 ?  ?FINDINGS: ?Gallbladder: ?  ?15 mm nonshadowing gallstone layers dependently within the ?gallbladder lumen. Gallbladder wall thickening measuring 5 mm. Trace ?pericholecystic free fluid. Positive sonographic Murphy sign. ?Incidental comet tail artifact along the anterior gallbladder wall ?may reflect superimposed adenomyomatosis. ?  ?Common bile duct: ?  ?Diameter: 4 mm ?  ?Liver: ?  ?Diffuse increased liver echotexture consistent with hepatic ?steatosis. 1.2 x 1.1 x 1.2 cm cyst within the left lobe liver, and a ?1.4 x 1.2 x 1.6 cm cyst within the posterior right lobe liver. No ?biliary duct dilation. Portal vein is patent on color Doppler ?imaging with normal direction of blood flow towards the liver. ?  ?Other: None. ?  ?IMPRESSION: ?1. Cholelithiasis, with sonographic evidence of acute cholecystitis. ?2. Diffuse hepatic steatosis. ?3. 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 ?  ?These results will be called to the ordering clinician or ?representative by the Radiologist Assistant, and communication ?documented in the PACS or [employer]. ?  ?  ?Electronically Signed ?  By: Randa Ngo M.D. ?  On: 01/29/2022 16:00 ? ?Assessment/Plan:  ?56 y.o. male with acute cholecystitis. ? ?Patient with history, physical exam and images consistent with acute cholecystitis. Patient oriented about diagnosis and surgical management as treatment.  ? ?Discussed the risk of surgery including post-op infxn, seroma, biloma, chronic pain, poor-delayed wound healing, retained gallstone, conversion to open procedure, post-op SBO or ileus, and need for additional procedures to address said risks.  The risks of general anesthetic including MI, CVA, sudden death or even reaction to anesthetic medications also  discussed. Alternatives include continued observation.  Benefits include possible symptom relief, prevention of complications including acute cholecystitis, pancreatitis. ? ?Gwendolyn Grant, MD ? ?

## 2022-01-29 NOTE — Anesthesia Postprocedure Evaluation (Signed)
Anesthesia Post Note ? ?Patient: Carl Schmidt ? ?Procedure(s) Performed: XI ROBOTIC ASSISTED LAPAROSCOPIC CHOLECYSTECTOMY (Abdomen) ?INDOCYANINE GREEN FLUORESCENCE IMAGING (ICG) (Abdomen) ? ?Patient location during evaluation: PACU ?Anesthesia Type: General ?Level of consciousness: awake and alert ?Pain management: pain level controlled ?Vital Signs Assessment: post-procedure vital signs reviewed and stable ?Respiratory status: spontaneous breathing, nonlabored ventilation, respiratory function stable and patient connected to nasal cannula oxygen ?Cardiovascular status: blood pressure returned to baseline and stable ?Postop Assessment: no apparent nausea or vomiting ?Anesthetic complications: no ? ? ?No notable events documented. ? ? ?Last Vitals:  ?Vitals:  ? 01/29/22 2115 01/29/22 2130  ?BP: 120/85 129/83  ?Pulse: 80 78  ?Resp: 19 17  ?Temp:  36.6 ?C  ?SpO2: 97% 99%  ?  ?Last Pain:  ?Vitals:  ? 01/29/22 2102  ?TempSrc:   ?PainSc: 3   ? ? ?  ?  ?  ?  ?  ?  ? ?Carl Schmidt ? ? ? ? ?

## 2022-01-29 NOTE — Transfer of Care (Signed)
Immediate Anesthesia Transfer of Care Note ? ?Patient: Carl Schmidt ? ?Procedure(s) Performed: XI ROBOTIC ASSISTED LAPAROSCOPIC CHOLECYSTECTOMY (Abdomen) ?INDOCYANINE GREEN FLUORESCENCE IMAGING (ICG) (Abdomen) ? ?Patient Location: PACU ? ?Anesthesia Type:General ? ?Level of Consciousness: sedated and patient cooperative ? ?Airway & Oxygen Therapy: Patient Spontanous Breathing and Patient connected to face mask oxygen ? ?Post-op Assessment: Report given to RN and Post -op Vital signs reviewed and stable ? ?Post vital signs: Reviewed and stable ? ?Last Vitals:  ?Vitals Value Taken Time  ?BP    ?Temp    ?Pulse    ?Resp    ?SpO2    ? ? ?Last Pain:  ?Vitals:  ? 01/29/22 1712  ?TempSrc: Temporal  ?PainSc: 5   ?   ? ?  ? ?Complications: No notable events documented. ?

## 2022-01-29 NOTE — Interval H&P Note (Signed)
History and Physical Interval Note: ? ?01/29/2022 ?5:26 PM ? ?Carl Schmidt  has presented today for surgery, with the diagnosis of cholecystitis.  The various methods of treatment have been discussed with the patient and family. After consideration of risks, benefits and other options for treatment, the patient has consented to  Procedure(s): ?XI ROBOTIC ASSISTED LAPAROSCOPIC CHOLECYSTECTOMY (N/A) as a surgical intervention.  The patient's history has been reviewed, patient examined, no change in status, stable for surgery.  I have reviewed the patient's chart and labs.  Questions were answered to the patient's satisfaction.   ? ? ?Carl Schmidt ? ? ?

## 2022-01-29 NOTE — H&P (Signed)
SURGICAL CONSULTATION NOTE  ? ?HISTORY OF PRESENT ILLNESS (HPI):  ?56 y.o. male presented to [facility] clinic primary care physician for evaluation of abdominal pain since 3 days ago. Patient reports he started having abdominal pain 3 days ago.  Pain localized to the right upper quadrant.  Pain does not radiate to other part of body.  Pain is aggravated by applying pressure.  There has been no alleviating factors.  Patient endorses having fever. ? ?Upon work-up by his PCP he was found with leukocytosis of 15,000.  He had CT scan of the abdomen that shows fat stranding around the bladder.  Abdominal ultrasound shows gallbladder with thickening with positive Murphy sign.  Common bile duct 0.4 mm.  Minimally elevated bilirubin with normal common bile duct stone.  I personally evaluated the images of the CT scan of the abdomen and the ultrasound of the abdomen. ? ?Surgery is consulted by Dr. Miller in this context for evaluation and management of acute cholecystitis. ? ?PAST MEDICAL HISTORY (PMH):  ?Past medical history reviewed.  No pertinent past medical history ? ?PAST SURGICAL HISTORY (PSH):  ?Past surgical history review.  No pertinent past surgical history ? ?MEDICATIONS:  ?Prior to Admission medications   ?Not on File  ?Patient denies taking any medication at home ? ?ALLERGIES:  ?No known drug allergies ? ?SOCIAL HISTORY:  ?Social History  ? ?Socioeconomic History  ? Marital status: Married  ?  Spouse name: Not on file  ? Number of children: Not on file  ? Years of education: Not on file  ? Highest education level: Not on file  ?Occupational History  ? Not on file  ?Tobacco Use  ? Smoking status: Not on file  ? Smokeless tobacco: Not on file  ?Substance and Sexual Activity  ? Alcohol use: Not on file  ? Drug use: Not on file  ? Sexual activity: Not on file  ?Other Topics Concern  ? Not on file  ?Social History Narrative  ? Not on file  ? ?Social Determinants of Health  ? ?Financial Resource Strain: Not on file   ?Food Insecurity: Not on file  ?Transportation Needs: Not on file  ?Physical Activity: Not on file  ?Stress: Not on file  ?Social Connections: Not on file  ?Intimate Partner Violence: Not on file  ?  ? ?FAMILY HISTORY:  ?Family history reviewed.  No pertinent family history ? ?REVIEW OF SYSTEMS:  ?Constitutional: denies weight loss, positive for fever, chills, or sweats  ?Eyes: denies any other vision changes, history of eye injury  ?ENT: denies sore throat, hearing problems  ?Respiratory: denies shortness of breath, wheezing  ?Cardiovascular: denies chest pain, palpitations  ?Gastrointestinal: positive abdominal pain, nausea and vomitnig ?Genitourinary: denies burning with urination or urinary frequency ?Musculoskeletal: denies any other joint pains or cramps  ?Skin: denies any other rashes or skin discolorations  ?Neurological: denies any other headache, dizziness, weakness  ?Psychiatric: denies any other depression, anxiety  ? ?All other review of systems were negative  ? ?VITAL SIGNS:  ?Blood pressure: 118/82 ?Heart rate: 85 ?Temperature 37.7 ?C ?Pain score: 5 out of 10 localized to the right upper quadrant ? ? ?PHYSICAL EXAM:  ?Constitutional:  ?-- Normal body habitus  ?-- Awake, alert, and oriented x3  ?Eyes:  ?-- Pupils equally round and reactive to light  ?-- No scleral icterus  ?Ear, nose, and throat:  ?-- No jugular venous distension  ?Pulmonary:  ?-- No crackles  ?-- Equal breath sounds bilaterally ?-- Breathing non-labored at rest ?  Cardiovascular:  ?-- S1, S2 present  ?-- No pericardial rubs ?Gastrointestinal:  ?-- Abdomen soft, tender tender to palpation to the right upper quadrant, non-distended, no guarding or rebound tenderness ?-- No abdominal masses appreciated, pulsatile or otherwise  ?Musculoskeletal and Integumentary:  ?-- Wounds: None appreciated ?-- Extremities: B/L UE and LE FROM, hands and feet warm, no edema  ?Neurologic:  ?-- Motor function: intact and symmetric ?-- Sensation: intact and  symmetric ? ? ?Labs:  ?   ? View : No data to display.  ?  ?  ?  ? ?   ? View : No data to display.  ?  ?  ?  ? ?Imaging studies:  ?EXAM: ?ULTRASOUND ABDOMEN LIMITED RIGHT UPPER QUADRANT ?  ?COMPARISON:  01/29/2022 ?  ?FINDINGS: ?Gallbladder: ?  ?15 mm nonshadowing gallstone layers dependently within the ?gallbladder lumen. Gallbladder wall thickening measuring 5 mm. Trace ?pericholecystic free fluid. Positive sonographic Murphy sign. ?Incidental comet tail artifact along the anterior gallbladder wall ?may reflect superimposed adenomyomatosis. ?  ?Common bile duct: ?  ?Diameter: 4 mm ?  ?Liver: ?  ?Diffuse increased liver echotexture consistent with hepatic ?steatosis. 1.2 x 1.1 x 1.2 cm cyst within the left lobe liver, and a ?1.4 x 1.2 x 1.6 cm cyst within the posterior right lobe liver. No ?biliary duct dilation. Portal vein is patent on color Doppler ?imaging with normal direction of blood flow towards the liver. ?  ?Other: None. ?  ?IMPRESSION: ?1. Cholelithiasis, with sonographic evidence of acute cholecystitis. ?2. Diffuse hepatic steatosis. ?3. 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 ?  ?These results will be called to the ordering clinician or ?representative by the Radiologist Assistant, and communication ?documented in the PACS or [employer] [facility]. ?  ?  ?Electronically Signed ?  By: Michael  Brown M.D. ?  On: 01/29/2022 16:00 ? ?Assessment/Plan:  ?56 y.o. male with acute cholecystitis. ? ?Patient with history, physical exam and images consistent with acute cholecystitis. Patient oriented about diagnosis and surgical management as treatment.  ? ?Discussed the risk of surgery including post-op infxn, seroma, biloma, chronic pain, poor-delayed wound healing, retained gallstone, conversion to open procedure, post-op SBO or ileus, and need for additional procedures to address said risks.  The risks of general anesthetic including MI, CVA, sudden death or even reaction to anesthetic medications also  discussed. Alternatives include continued observation.  Benefits include possible symptom relief, prevention of complications including acute cholecystitis, pancreatitis. ? ?Eloni Darius Cintr?n-D?az, MD ? ?

## 2022-01-30 ENCOUNTER — Encounter: Payer: Self-pay | Admitting: General Surgery

## 2022-01-30 DIAGNOSIS — K8000 Calculus of gallbladder with acute cholecystitis without obstruction: Secondary | ICD-10-CM | POA: Diagnosis not present

## 2022-01-30 MED ORDER — TRAMADOL HCL 50 MG PO TABS: 50.0000 mg | ORAL_TABLET | Freq: Four times a day (QID) | ORAL | 0 refills | Status: AC | PRN

## 2022-01-30 NOTE — Discharge Summary (Signed)
?  Patient ID: ?Ukraine Cardenas ?MRN: [id number] ?DOB/AGE: 1966/04/02 56 y.o. ? ?Admit date: 01/29/2022 ?Discharge date: 01/30/2022 ? ? ?Discharge Diagnoses:  ?Principal Problem: ?  Acute cholecystitis ? ? ?Procedures: Robotic assisted laparoscopic cholecystectomy ? ?Hospital Course: Patient with acute cholecystitis.  He underwent robotic assisted laparoscopic cholecystectomy.  This morning patient recovering adequately.  Pain controlled with current pain medications.  Patient with expected soreness on the left periumbilical area.  Patient tolerating diet.  Patient ambulating.  Wounds are dry and clean. ? ?Physical Exam ?HENT:  ?   Head: Normocephalic.  ?Cardiovascular:  ?   Rate and Rhythm: Normal rate and regular rhythm.  ?   Pulses: Normal pulses.  ?   Heart sounds: Normal heart sounds.  ?Pulmonary:  ?   Effort: Pulmonary effort is normal.  ?Abdominal:  ?   General: Abdomen is flat. Bowel sounds are normal.  ?Musculoskeletal:  ?   Cervical back: Normal range of motion.  ?Skin: ?   Capillary Refill: Capillary refill takes less than 2 seconds.  ?Neurological:  ?   General: No focal deficit present.  ?   Mental Status: He is alert.  ? ? ? ?Consults: None ? ?Disposition: Discharge disposition: 01-Home or Self Care ? ? ? ? ? ? ?Discharge Instructions   ? ? Diet - low sodium heart healthy   Complete by: As directed ?  ? Increase activity slowly   Complete by: As directed ?  ? ?  ? ?Allergies as of 01/30/2022   ?Not on File ?  ? ?  ?Medication List  ?  ? ?TAKE these medications   ? ?traMADol 50 MG tablet ?Commonly known as: Ultram ?Take 1 tablet (50 mg total) by mouth every 6 (six) hours as needed for up to 5 days. ?  ? ?  ? ? Follow-up Information   ? ? Carolan Shiver, MD Follow up in 2 week(s).   ?Specialty: General Surgery ?Why: Follow up after cholecystectomy ?Contact information: ?[address] ?[place] [address] ?[phone] ? ? ?  ?  ? ?  ?  ? ?  ? ? ?

## 2022-01-30 NOTE — Progress Notes (Signed)
Discharge Note: ?Reviewed discharge instructions with pt.  ?Pt verbalized understanding. IV intact when removed. Pt d'ced with all personal belongings. Staff wheeled pt out. Pt transported to home by wife.  ?

## 2022-01-30 NOTE — TOC Progression Note (Signed)
Transition of Care (TOC) - Progression Note  ? ? ?Patient Details  ?Name: Carl Schmidt ?MRN: [id number] ?Date of Birth: 23-Oct-1966 ? ?Transition of Care (TOC) CM/SW Contact  ?Marlowe Sax, RN ?Phone Number: ?01/30/2022, 11:13 AM ? ?Clinical Narrative:    ? ?Transition of Care (TOC) Screening Note ? ? ?Patient Details  ?Name: Carl Schmidt ?Date of Birth: [birth year] ? ? ?Transition of Care (TOC) CM/SW Contact:    ?Marlowe Sax, RN ?Phone Number: ?01/30/2022, 11:13 AM ? ? ? ?Transition of Care Department [facility]) has reviewed patient and no TOC needs have been identified at this time. We will continue to monitor patient advancement through interdisciplinary progression rounds. If new patient transition needs arise, please place a TOC consult. ?  ? ? ?  ?  ? ?Expected Discharge Plan and Services ?  ?  ?  ?  ?  ?Expected Discharge Date: 01/30/22               ?  ?  ?  ?  ?  ?  ?  ?  ?  ?  ? ? ?Social Determinants of Health (SDOH) Interventions ?  ? ?Readmission Risk Interventions ?   ? View : No data to display.  ?  ?  ?  ? ? ?

## 2022-01-30 NOTE — Discharge Instructions (Signed)

## 2022-01-31 LAB — SURGICAL PATHOLOGY

## 2022-02-18 ENCOUNTER — Other Ambulatory Visit: Payer: Self-pay | Admitting: General Surgery

## 2022-02-18 DIAGNOSIS — R1011 Right upper quadrant pain: Secondary | ICD-10-CM

## 2022-02-21 ENCOUNTER — Ambulatory Visit
Admission: RE | Admit: 2022-02-21 | Discharge: 2022-02-21 | Disposition: A | Discharge status: Home / Self Care | Payer: 59 | Source: Ambulatory Visit | Attending: General Surgery | Admitting: General Surgery

## 2022-02-21 DIAGNOSIS — R1011 Right upper quadrant pain: Secondary | ICD-10-CM | POA: Insufficient documentation

## 2023-02-05 ENCOUNTER — Other Ambulatory Visit: Payer: Self-pay | Admitting: Internal Medicine

## 2023-02-05 ENCOUNTER — Other Ambulatory Visit: Payer: Self-pay

## 2023-02-05 ENCOUNTER — Ambulatory Visit
Admission: RE | Admit: 2023-02-05 | Discharge: 2023-02-05 | Disposition: A | Discharge status: Home / Self Care | Payer: 59 | Source: Ambulatory Visit | Attending: Internal Medicine | Admitting: Internal Medicine

## 2023-02-05 ENCOUNTER — Encounter: Payer: Self-pay | Admitting: Internal Medicine

## 2023-02-05 DIAGNOSIS — K352 Acute appendicitis with generalized peritonitis, without perforation or abscess: Secondary | ICD-10-CM

## 2023-02-06 ENCOUNTER — Other Ambulatory Visit: Payer: PRIVATE HEALTH INSURANCE

## 2023-02-06 ENCOUNTER — Other Ambulatory Visit: Payer: 59

## 2023-10-29 IMAGING — US US ABDOMEN LIMITED
1 series · 14 of 25 positions shown · non-contrast
Comparison: 01/29/2022

CLINICAL DATA: Right upper quadrant pain, fever, vomiting for 3
days

EXAM:
ULTRASOUND ABDOMEN LIMITED RIGHT UPPER QUADRANT

[Series 1: us abdomen limited · 0.25mm/px · 14 of 41 slices shown]
[im 1/41]
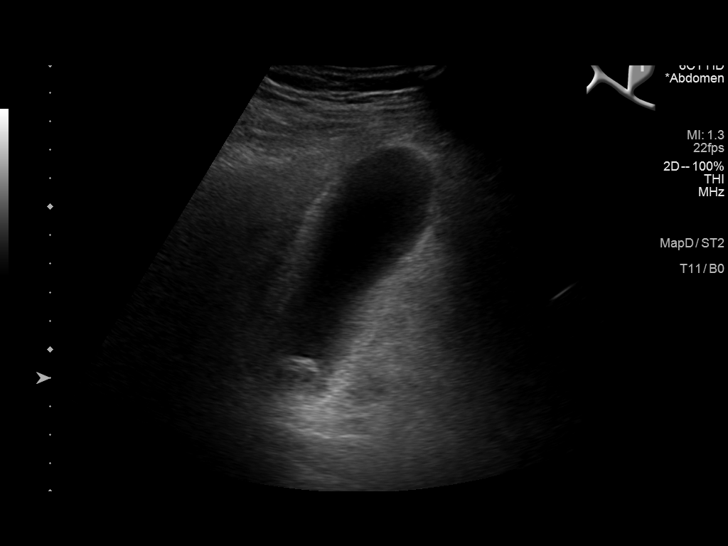
[im 4/41]
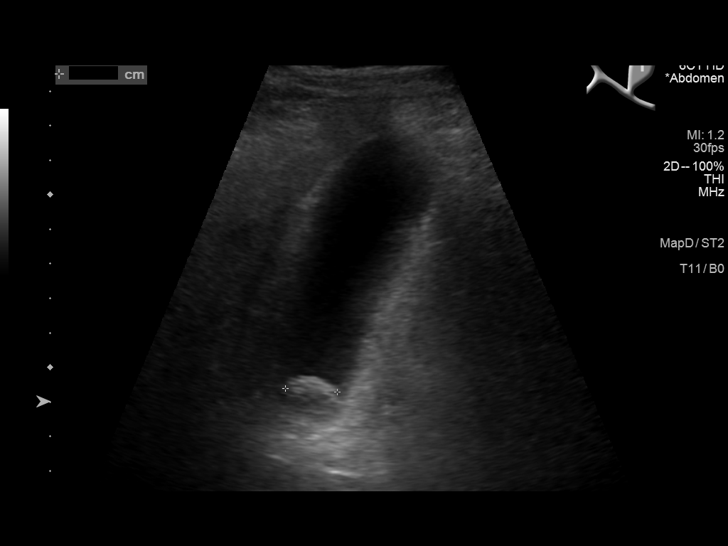
[im 7/41]
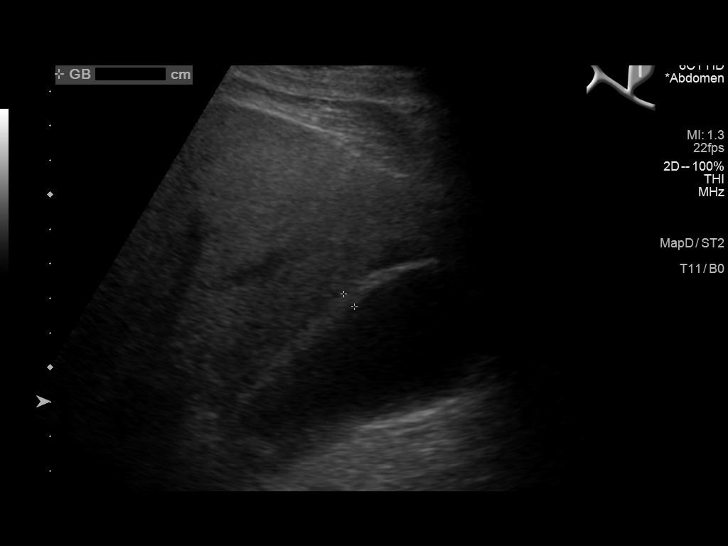
[im 11/41]
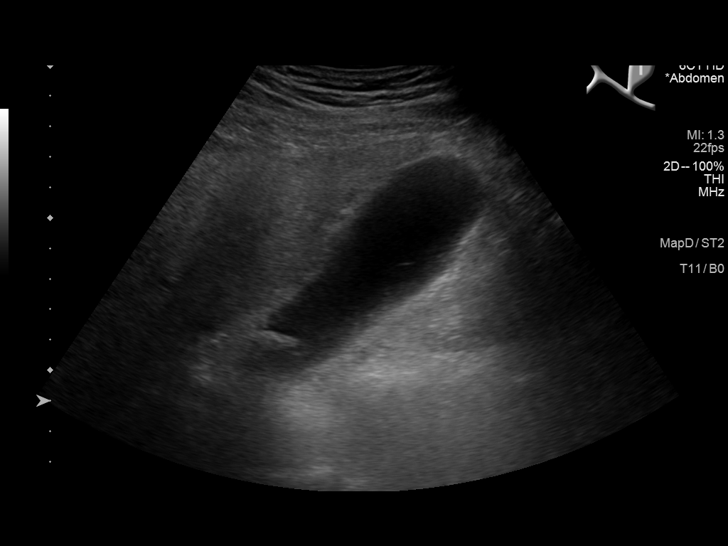
[im 14/41]
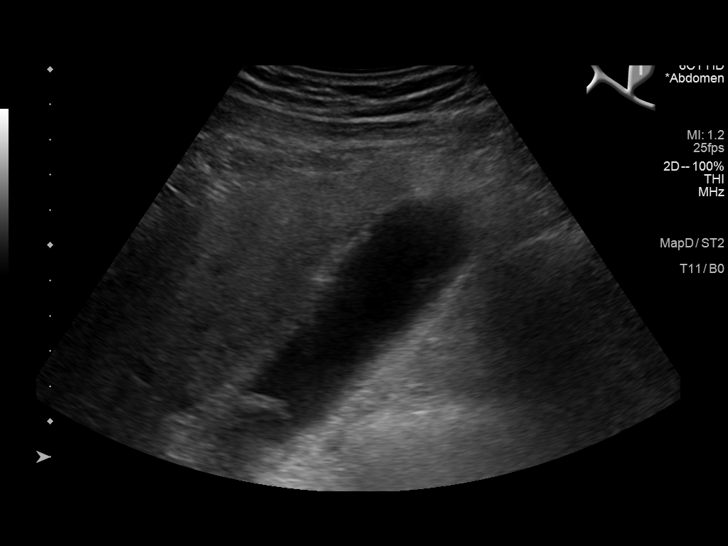
[im 16/41]
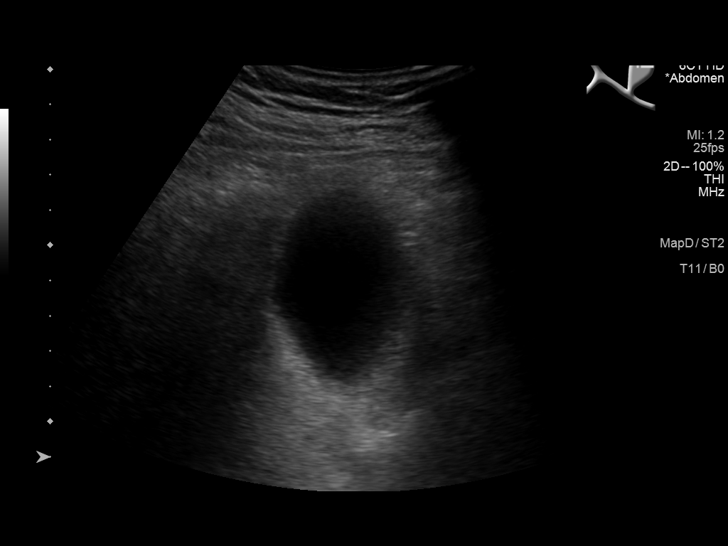
[im 19/41]
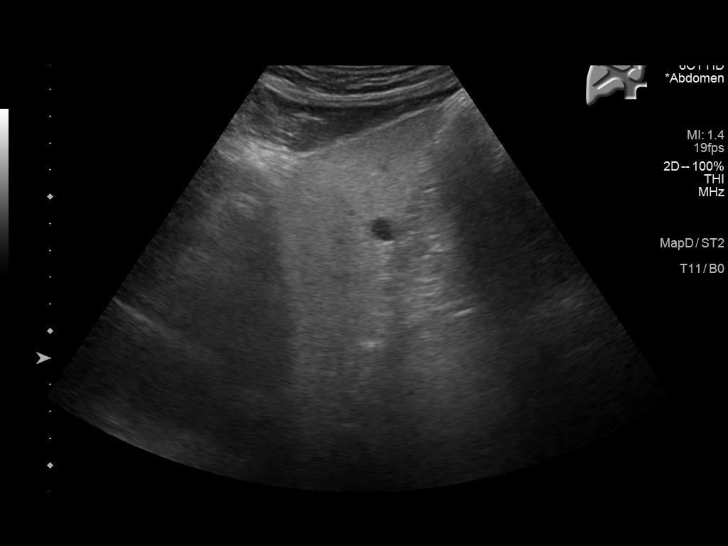
[im 22/41]
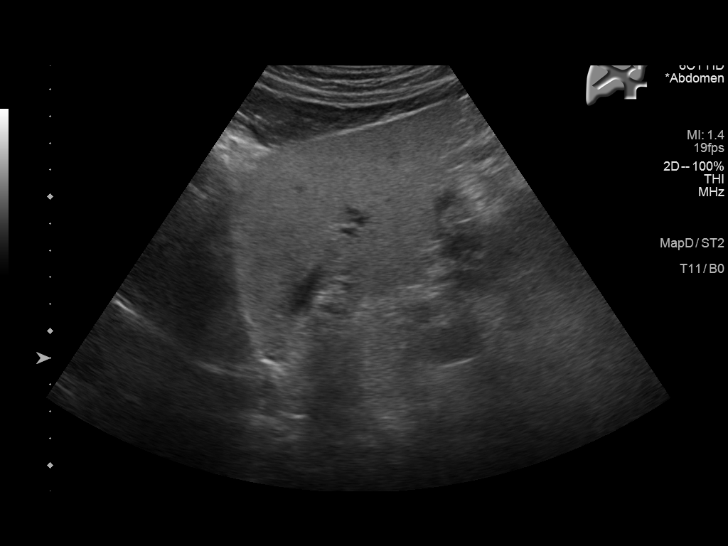
[im 26/41]
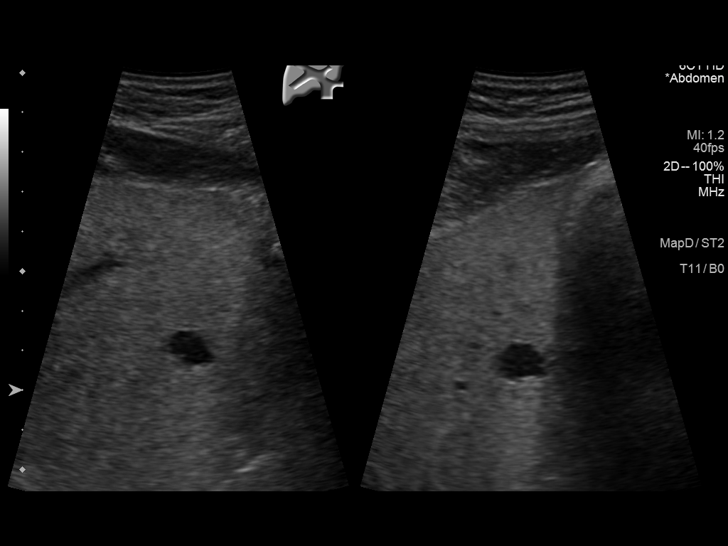
[im 27/41]
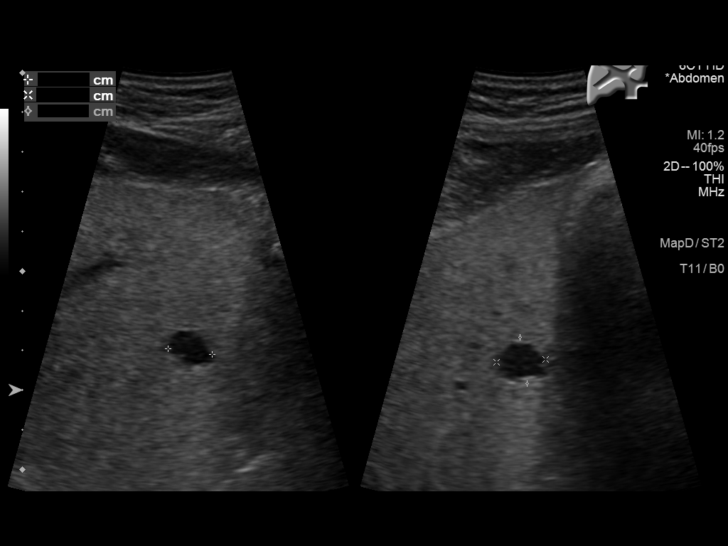
[im 31/41]
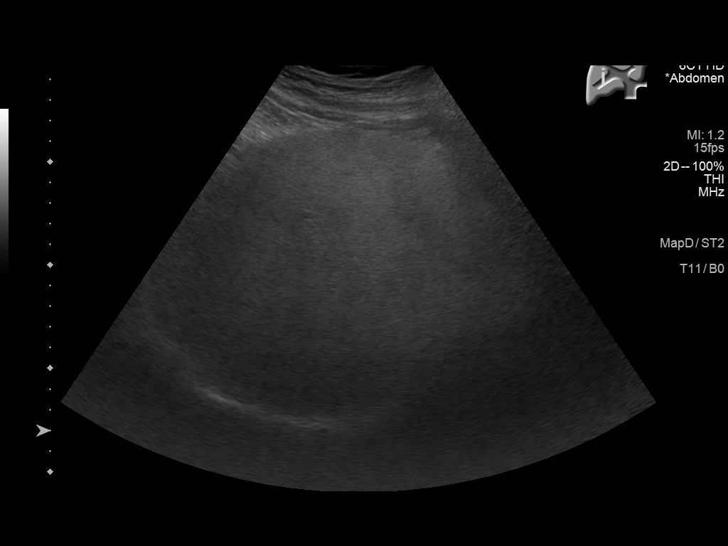
[im 34/41]
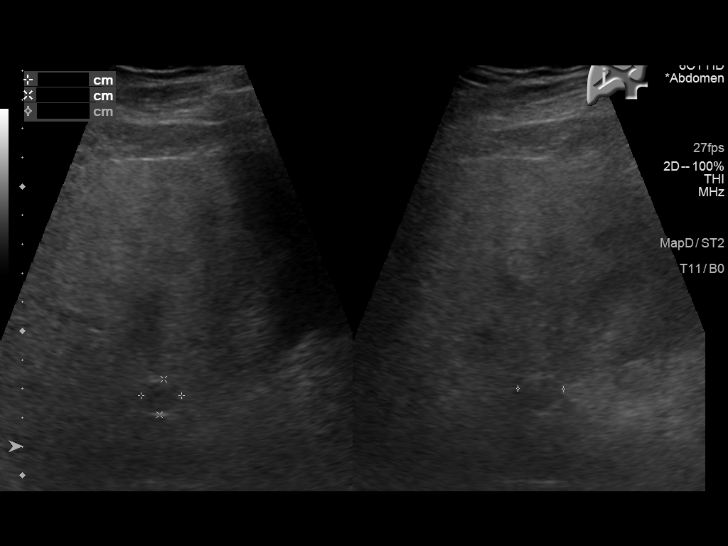
[im 37/41]
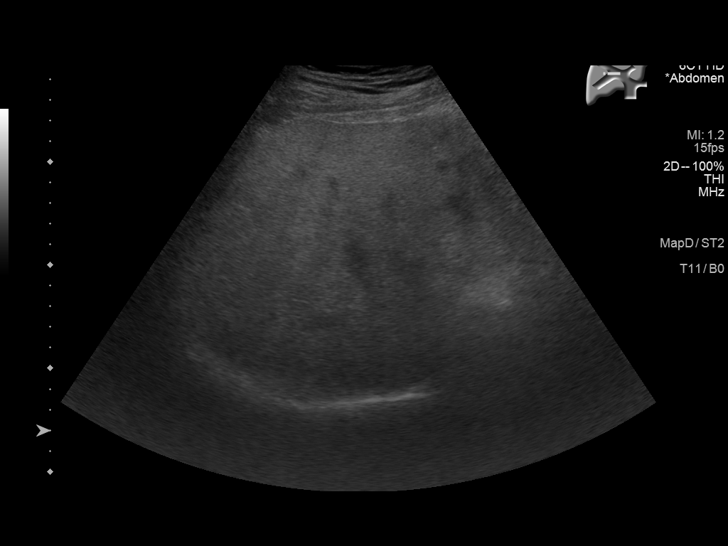
[im 41/41]
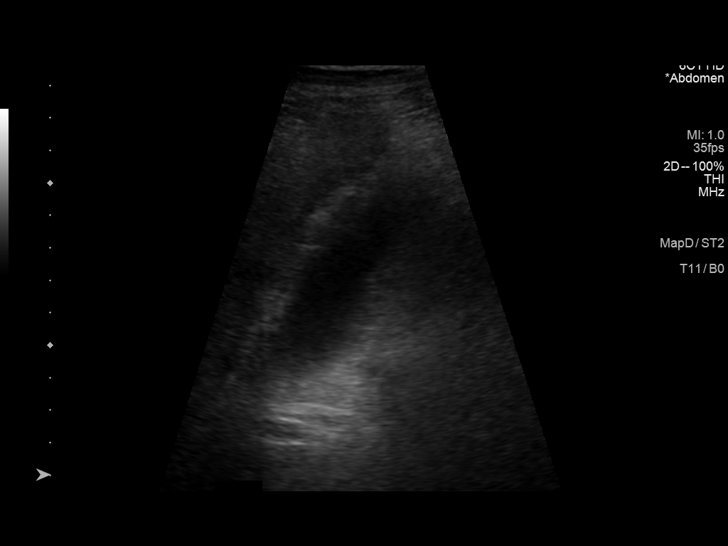

[14 of 25 positions shown; findings below may reference images not displayed]

FINDINGS: Gallbladder:

15 mm nonshadowing gallstone layers dependently within the
gallbladder lumen. Gallbladder wall thickening measuring 5 mm. Trace
pericholecystic free fluid. Positive sonographic Murphy sign.
Incidental comet tail artifact along the anterior gallbladder wall
may reflect superimposed adenomyomatosis.

Common bile duct:

Diameter: 4 mm

Liver:

Diffuse increased liver echotexture consistent with hepatic
steatosis. 1.2 x 1.1 x 1.2 cm cyst within the left lobe liver, and a
1.4 x 1.2 x 1.6 cm cyst within the posterior right lobe liver. No
biliary duct dilation. Portal vein is patent on color Doppler
imaging with normal direction of blood flow towards the liver.

Other: None.
IMPRESSION: 1. Cholelithiasis, with sonographic evidence of acute cholecystitis.
2. Diffuse hepatic steatosis.
3. Likely superimposed gallbladder adenomyomatosis.

These results will be called to the ordering clinician or
representative by the Radiologist Assistant, and communication
documented in the PACS or [REDACTED].

## 2023-11-21 IMAGING — US US ABDOMEN LIMITED
1 series · 14 of 25 positions shown · non-contrast
Comparison: Abdominal ultrasound January 29, 2022

CLINICAL DATA: Right upper quadrant pain.  Prior cholecystectomy.

EXAM:
ULTRASOUND ABDOMEN LIMITED RIGHT UPPER QUADRANT

[Series 1: us abdomen limited ruq (liver/gb) · 14 of 46 slices shown]
[im 1/46]
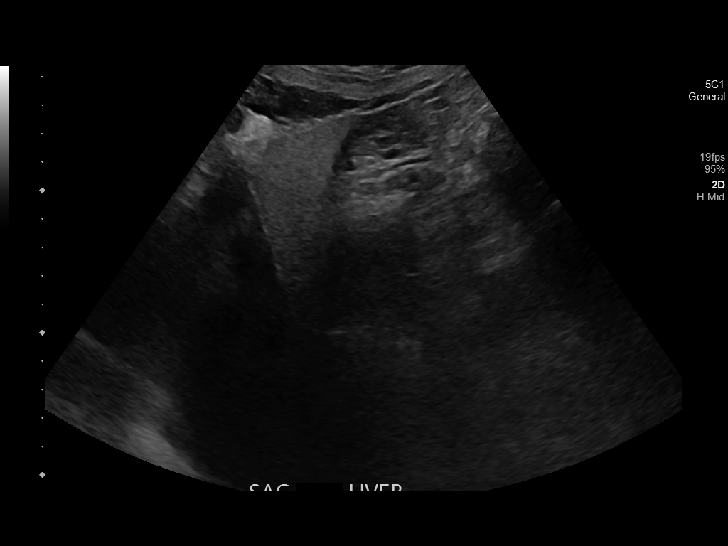
[im 4/46]
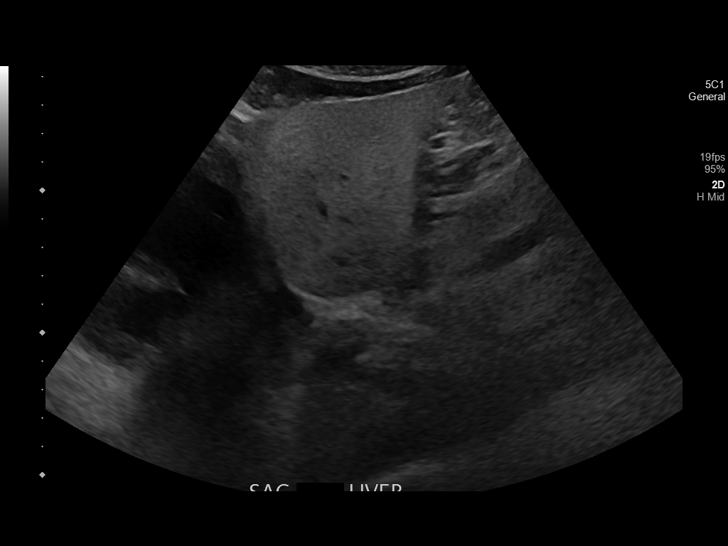
[im 8/46]
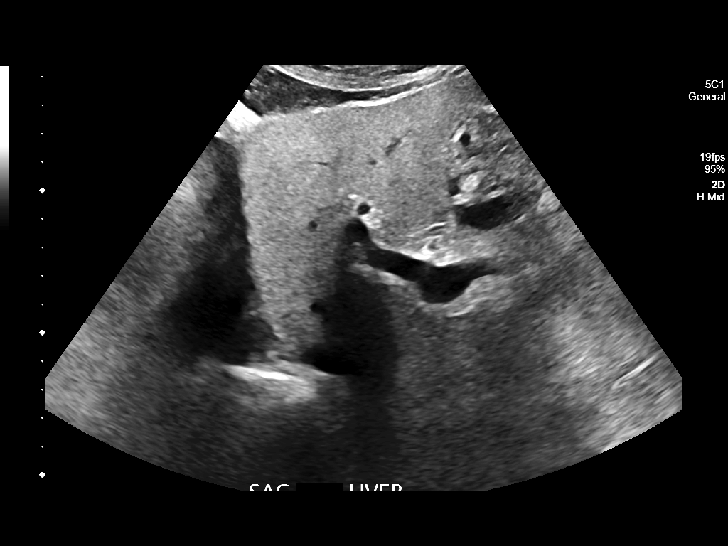
[im 12/46]
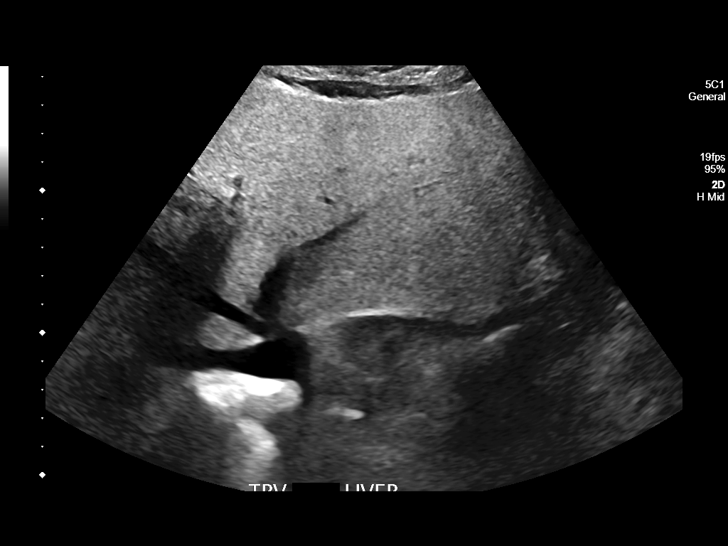
[im 16/46]
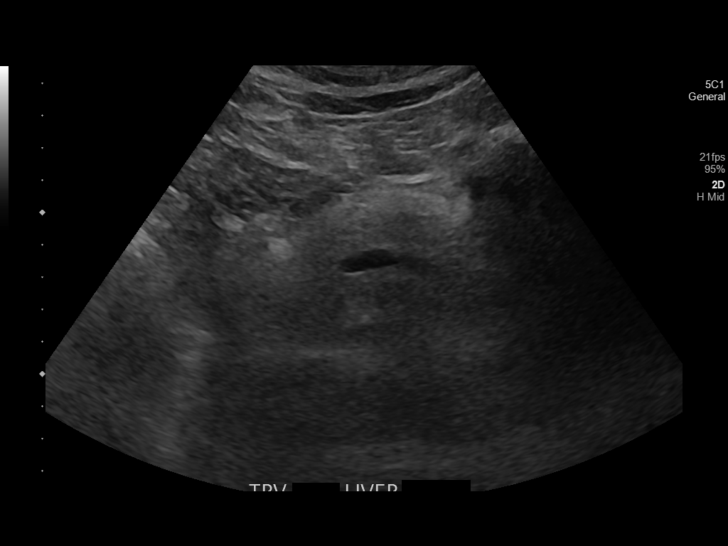
[im 17/46]
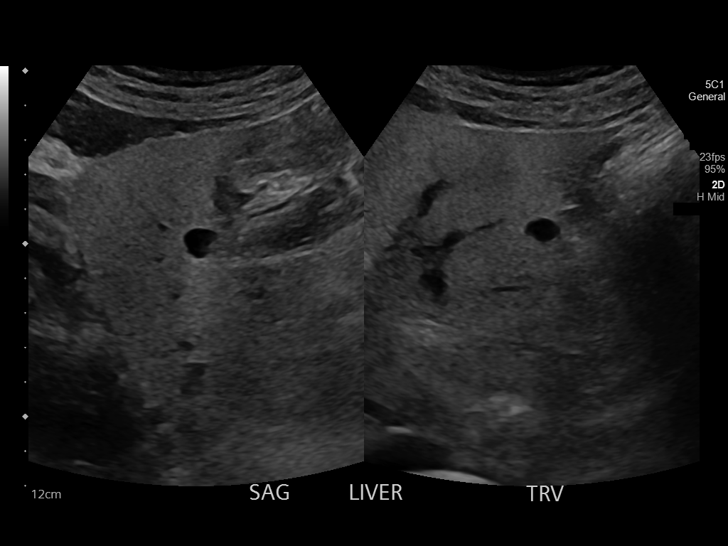
[im 21/46]
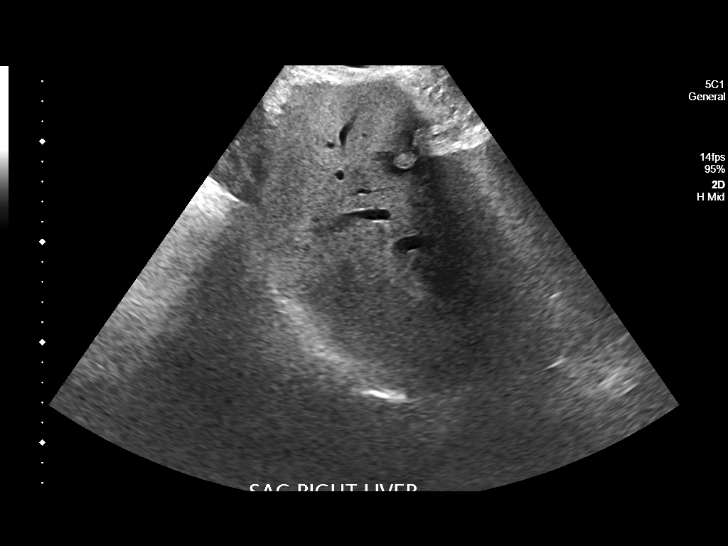
[im 25/46]
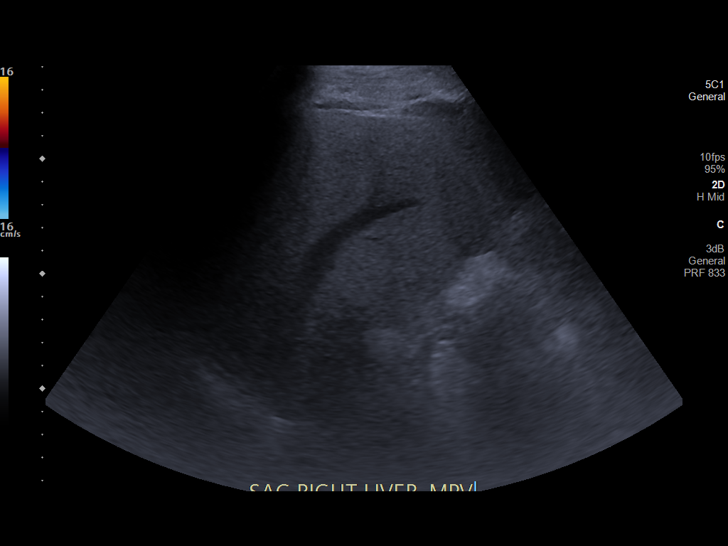
[im 29/46]
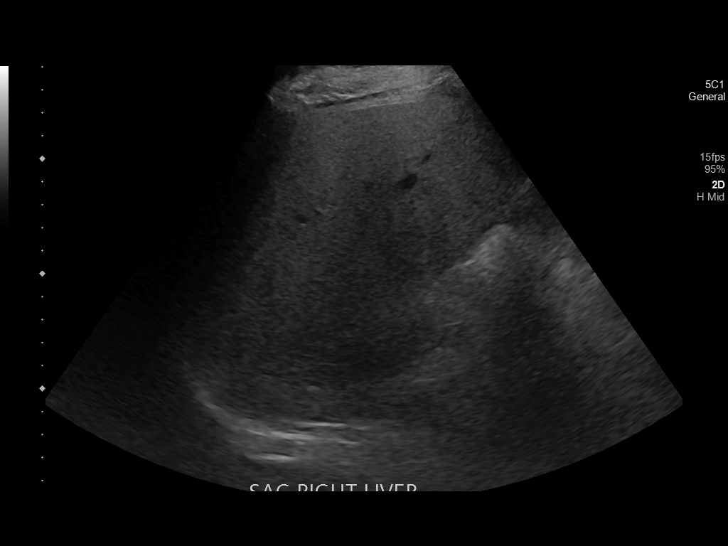
[im 31/46]
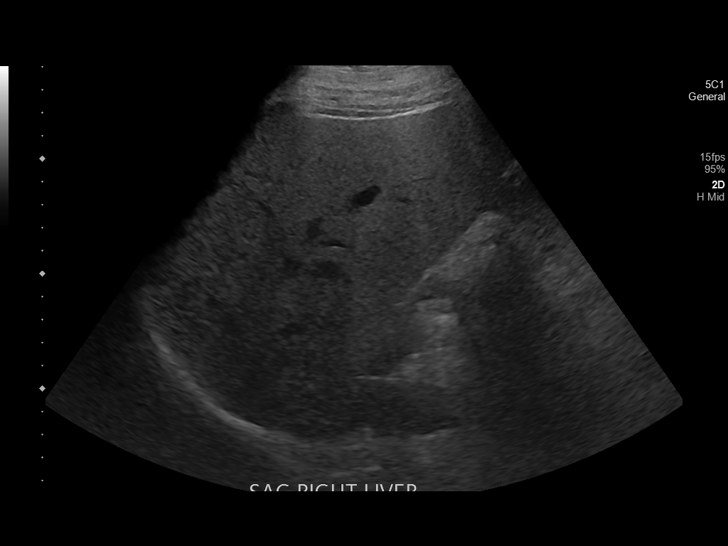
[im 34/46]
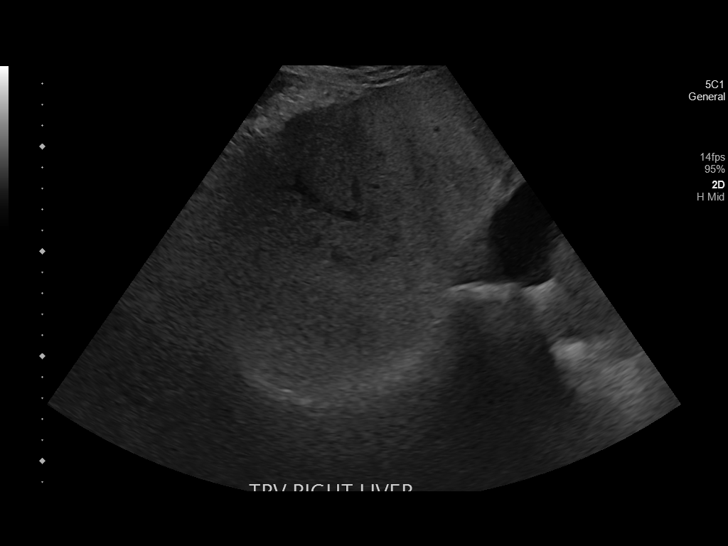
[im 38/46]
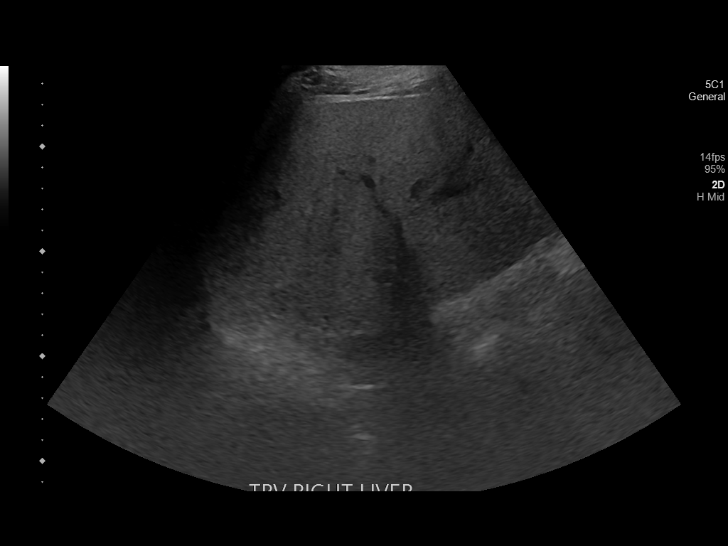
[im 42/46]
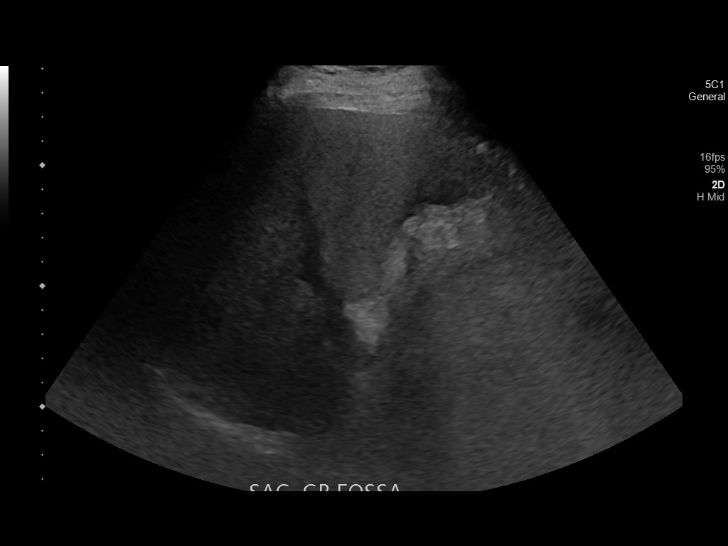
[im 46/46]
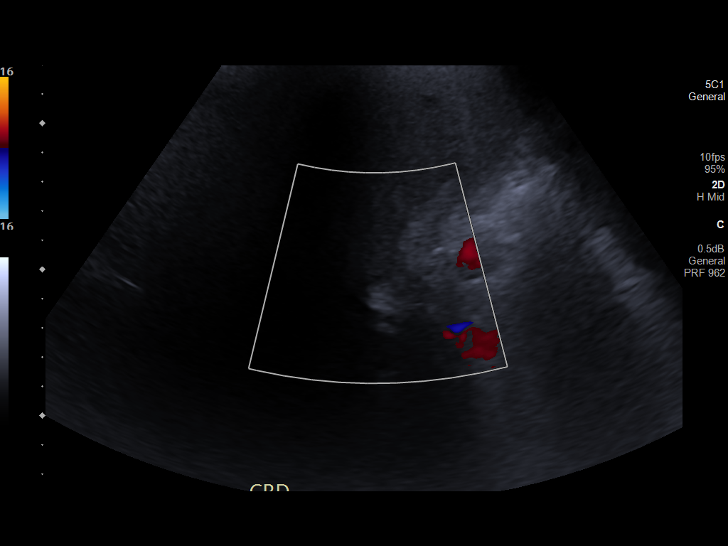

[14 of 25 positions shown; findings below may reference images not displayed]

FINDINGS: Gallbladder:

Surgically absent

Common bile duct:

Diameter: 6 mm

Liver:

Markedly increased in echogenicity. There is a 1.0 x 1.0 x 0.7 cm
cyst within the left hepatic lobe. Portal vein is patent on color
Doppler imaging with normal direction of blood flow towards the
liver.

Other: None.
IMPRESSION: Prior cholecystectomy.

Increased hepatic parenchymal echogenicity suggestive of steatosis.
